# Patient Record
Sex: Female | Born: 1960 | Race: White | Hispanic: No | Smoking: Former smoker
Health system: Southern US, Community
[De-identification: ages and names within clinical notes are randomized; demographics above are authoritative.]

## PROBLEM LIST (undated history)

## (undated) DIAGNOSIS — R011 Cardiac murmur, unspecified: Secondary | ICD-10-CM

## (undated) DIAGNOSIS — G4730 Sleep apnea, unspecified: Secondary | ICD-10-CM

## (undated) DIAGNOSIS — E119 Type 2 diabetes mellitus without complications: Secondary | ICD-10-CM

## (undated) HISTORY — PX: OTHER SURGICAL HISTORY: SHX169

## (undated) HISTORY — PX: DILATION AND CURETTAGE OF UTERUS: SHX78

## (undated) HISTORY — DX: Type 2 diabetes mellitus without complications: E11.9

## (undated) HISTORY — PX: TUBAL LIGATION: SHX77

## (undated) HISTORY — PX: TONSILLECTOMY: SUR1361

---

## 1997-11-06 ENCOUNTER — Emergency Department (HOSPITAL_COMMUNITY): Admission: EM | Admit: 1997-11-06 | Discharge: 1997-11-06 | Payer: Self-pay | Admitting: Emergency Medicine

## 1998-06-20 ENCOUNTER — Other Ambulatory Visit: Admission: RE | Admit: 1998-06-20 | Discharge: 1998-06-20 | Payer: Self-pay | Admitting: Internal Medicine

## 2002-01-26 DIAGNOSIS — E119 Type 2 diabetes mellitus without complications: Secondary | ICD-10-CM

## 2002-01-26 HISTORY — DX: Type 2 diabetes mellitus without complications: E11.9

## 2002-11-16 ENCOUNTER — Ambulatory Visit (HOSPITAL_COMMUNITY): Admission: RE | Admit: 2002-11-16 | Discharge: 2002-11-16 | Payer: Self-pay | Admitting: Obstetrics & Gynecology

## 2003-01-04 ENCOUNTER — Other Ambulatory Visit: Admission: RE | Admit: 2003-01-04 | Discharge: 2003-01-04 | Payer: Self-pay | Admitting: Dermatology

## 2003-01-25 ENCOUNTER — Ambulatory Visit (HOSPITAL_COMMUNITY): Admission: RE | Admit: 2003-01-25 | Discharge: 2003-01-25 | Payer: Self-pay | Admitting: Obstetrics & Gynecology

## 2004-01-27 HISTORY — PX: OOPHORECTOMY: SHX86

## 2004-12-08 ENCOUNTER — Ambulatory Visit: Payer: Self-pay | Admitting: Endocrinology

## 2004-12-30 ENCOUNTER — Ambulatory Visit: Payer: Self-pay | Admitting: Internal Medicine

## 2004-12-30 ENCOUNTER — Ambulatory Visit: Payer: Self-pay | Admitting: Endocrinology

## 2005-02-05 ENCOUNTER — Ambulatory Visit: Payer: Self-pay | Admitting: Endocrinology

## 2005-02-19 ENCOUNTER — Ambulatory Visit: Payer: Self-pay | Admitting: Endocrinology

## 2005-04-07 ENCOUNTER — Ambulatory Visit: Payer: Self-pay | Admitting: Endocrinology

## 2005-05-08 ENCOUNTER — Encounter (INDEPENDENT_AMBULATORY_CARE_PROVIDER_SITE_OTHER): Payer: Self-pay | Admitting: *Deleted

## 2005-05-08 ENCOUNTER — Ambulatory Visit (HOSPITAL_BASED_OUTPATIENT_CLINIC_OR_DEPARTMENT_OTHER): Admission: RE | Admit: 2005-05-08 | Discharge: 2005-05-08 | Payer: Self-pay | Admitting: Otolaryngology

## 2005-06-02 ENCOUNTER — Ambulatory Visit: Payer: Self-pay | Admitting: Endocrinology

## 2006-02-04 ENCOUNTER — Ambulatory Visit: Payer: Self-pay | Admitting: Internal Medicine

## 2006-02-04 ENCOUNTER — Ambulatory Visit: Payer: Self-pay | Admitting: Endocrinology

## 2006-02-04 LAB — CONVERTED CEMR LAB
AST: 22 units/L (ref 0–37)
Albumin: 3.6 g/dL (ref 3.5–5.2)
Alkaline Phosphatase: 71 units/L (ref 39–117)
BUN: 8 mg/dL (ref 6–23)
Chloride: 99 meq/L (ref 96–112)
Chol/HDL Ratio, serum: 8.1
Cholesterol: 295 mg/dL (ref 0–200)
Creatinine,U: 39.5 mg/dL
GFR calc non Af Amer: 142 mL/min
Hgb A1c MFr Bld: 12.7 % — ABNORMAL HIGH (ref 4.6–6.0)
LDL DIRECT: 117 mg/dL
Microalb, Ur: 0.9 mg/dL (ref 0.0–1.9)
Sodium: 135 meq/L (ref 135–145)
TSH: 2.34 microintl units/mL (ref 0.35–5.50)
VLDL: 141 mg/dL — ABNORMAL HIGH (ref 0–40)

## 2006-03-03 ENCOUNTER — Ambulatory Visit: Payer: Self-pay | Admitting: Endocrinology

## 2006-04-07 ENCOUNTER — Ambulatory Visit: Payer: Self-pay | Admitting: Endocrinology

## 2006-04-28 ENCOUNTER — Encounter (INDEPENDENT_AMBULATORY_CARE_PROVIDER_SITE_OTHER): Payer: Self-pay | Admitting: Specialist

## 2006-04-28 ENCOUNTER — Ambulatory Visit (HOSPITAL_COMMUNITY): Admission: RE | Admit: 2006-04-28 | Discharge: 2006-04-28 | Payer: Self-pay | Admitting: Obstetrics & Gynecology

## 2006-06-08 ENCOUNTER — Ambulatory Visit: Payer: Self-pay | Admitting: Endocrinology

## 2006-08-16 ENCOUNTER — Encounter: Payer: Self-pay | Admitting: Endocrinology

## 2006-08-16 DIAGNOSIS — J309 Allergic rhinitis, unspecified: Secondary | ICD-10-CM | POA: Insufficient documentation

## 2006-08-16 DIAGNOSIS — E109 Type 1 diabetes mellitus without complications: Secondary | ICD-10-CM | POA: Insufficient documentation

## 2006-12-21 ENCOUNTER — Ambulatory Visit: Payer: Self-pay | Admitting: Endocrinology

## 2006-12-21 ENCOUNTER — Encounter (INDEPENDENT_AMBULATORY_CARE_PROVIDER_SITE_OTHER): Payer: Self-pay | Admitting: *Deleted

## 2006-12-21 DIAGNOSIS — R079 Chest pain, unspecified: Secondary | ICD-10-CM

## 2006-12-21 DIAGNOSIS — F411 Generalized anxiety disorder: Secondary | ICD-10-CM | POA: Insufficient documentation

## 2006-12-27 ENCOUNTER — Ambulatory Visit: Payer: Self-pay

## 2006-12-27 ENCOUNTER — Encounter: Payer: Self-pay | Admitting: Endocrinology

## 2007-01-19 ENCOUNTER — Encounter: Payer: Self-pay | Admitting: Endocrinology

## 2007-01-24 ENCOUNTER — Ambulatory Visit: Payer: Self-pay | Admitting: Endocrinology

## 2007-03-08 ENCOUNTER — Ambulatory Visit: Payer: Self-pay | Admitting: Endocrinology

## 2007-03-08 DIAGNOSIS — E7800 Pure hypercholesterolemia, unspecified: Secondary | ICD-10-CM

## 2007-03-08 LAB — CONVERTED CEMR LAB: Hgb A1c MFr Bld: 8 % — ABNORMAL HIGH (ref 4.6–6.0)

## 2007-03-31 ENCOUNTER — Telehealth (INDEPENDENT_AMBULATORY_CARE_PROVIDER_SITE_OTHER): Payer: Self-pay | Admitting: *Deleted

## 2007-10-24 ENCOUNTER — Encounter: Payer: Self-pay | Admitting: Endocrinology

## 2008-01-17 ENCOUNTER — Ambulatory Visit: Payer: Self-pay | Admitting: Internal Medicine

## 2008-01-17 DIAGNOSIS — J019 Acute sinusitis, unspecified: Secondary | ICD-10-CM | POA: Insufficient documentation

## 2008-01-17 DIAGNOSIS — R040 Epistaxis: Secondary | ICD-10-CM | POA: Insufficient documentation

## 2008-02-17 ENCOUNTER — Ambulatory Visit: Payer: Self-pay | Admitting: Internal Medicine

## 2008-02-17 LAB — CONVERTED CEMR LAB
CO2: 32 meq/L (ref 19–32)
Chloride: 90 meq/L — ABNORMAL LOW (ref 96–112)
GFR calc non Af Amer: 114 mL/min
Hgb A1c MFr Bld: 13.2 % — ABNORMAL HIGH (ref 4.6–6.0)
Potassium: 3.6 meq/L (ref 3.5–5.1)
Total CHOL/HDL Ratio: 7.5
VLDL: 138 mg/dL — ABNORMAL HIGH (ref 0–40)

## 2008-02-20 ENCOUNTER — Ambulatory Visit: Payer: Self-pay | Admitting: Endocrinology

## 2008-03-27 ENCOUNTER — Ambulatory Visit: Payer: Self-pay | Admitting: Endocrinology

## 2008-03-27 DIAGNOSIS — R109 Unspecified abdominal pain: Secondary | ICD-10-CM | POA: Insufficient documentation

## 2008-03-27 LAB — CONVERTED CEMR LAB
Albumin: 3.9 g/dL (ref 3.5–5.2)
Amylase: 52 units/L (ref 27–131)
BUN: 15 mg/dL (ref 6–23)
Basophils Absolute: 0 10*3/uL (ref 0.0–0.1)
Bilirubin, Direct: 0.1 mg/dL (ref 0.0–0.3)
Calcium: 9.6 mg/dL (ref 8.4–10.5)
Cholesterol: 384 mg/dL (ref 0–200)
Crystals: NEGATIVE
Direct LDL: 172.8 mg/dL
Eosinophils Absolute: 0.1 10*3/uL (ref 0.0–0.7)
Eosinophils Relative: 1.6 % (ref 0.0–5.0)
GFR calc Af Amer: 137 mL/min
Glucose, Bld: 486 mg/dL — ABNORMAL HIGH (ref 70–99)
HCT: 40.9 % (ref 36.0–46.0)
Hemoglobin, Urine: NEGATIVE
MCHC: 35.1 g/dL (ref 30.0–36.0)
MCV: 96.3 fL (ref 78.0–100.0)
Monocytes Absolute: 0.6 10*3/uL (ref 0.1–1.0)
Platelets: 264 10*3/uL (ref 150–400)
RDW: 13.6 % (ref 11.5–14.6)
Sodium: 132 meq/L — ABNORMAL LOW (ref 135–145)
Total Protein: 7.3 g/dL (ref 6.0–8.3)
Triglycerides: 777 mg/dL (ref 0–149)
Urine Glucose: >=1000 mg/dL — CR
Urobilinogen, UA: 0.2 (ref 0.0–1.0)

## 2008-04-06 ENCOUNTER — Encounter: Admission: RE | Admit: 2008-04-06 | Discharge: 2008-04-06 | Payer: Self-pay | Admitting: Endocrinology

## 2008-04-10 ENCOUNTER — Encounter: Payer: Self-pay | Admitting: Endocrinology

## 2008-04-12 ENCOUNTER — Ambulatory Visit (HOSPITAL_COMMUNITY): Admission: RE | Admit: 2008-04-12 | Discharge: 2008-04-12 | Payer: Self-pay | Admitting: General Surgery

## 2008-05-14 ENCOUNTER — Encounter: Payer: Self-pay | Admitting: Endocrinology

## 2008-05-31 ENCOUNTER — Encounter: Payer: Self-pay | Admitting: Endocrinology

## 2009-04-23 ENCOUNTER — Encounter (INDEPENDENT_AMBULATORY_CARE_PROVIDER_SITE_OTHER): Payer: Self-pay | Admitting: *Deleted

## 2009-04-23 LAB — CONVERTED CEMR LAB
Calcium: 9.7 mg/dL
GFR calc Af Amer: >60 mL/min
GFR calc non Af Amer: >60 mL/min
Glucose, Bld: 310 mg/dL

## 2009-09-18 ENCOUNTER — Telehealth: Payer: Self-pay | Admitting: Endocrinology

## 2009-09-18 ENCOUNTER — Ambulatory Visit: Payer: Self-pay | Admitting: Endocrinology

## 2009-09-18 DIAGNOSIS — E876 Hypokalemia: Secondary | ICD-10-CM

## 2009-09-18 LAB — CONVERTED CEMR LAB
Albumin: 4.3 g/dL (ref 3.5–5.2)
Alkaline Phosphatase: 125 units/L — ABNORMAL HIGH (ref 39–117)
Basophils Absolute: 0 10*3/uL (ref 0.0–0.1)
Bilirubin Urine: NEGATIVE
CO2: 29 meq/L (ref 19–32)
Calcium: 9.6 mg/dL (ref 8.4–10.5)
Cholesterol: 255 mg/dL — ABNORMAL HIGH (ref 0–200)
Creatinine, Ser: 0.4 mg/dL (ref 0.4–1.2)
Creatinine,U: 53.5 mg/dL
Direct LDL: 157.7 mg/dL
Eosinophils Absolute: 0.1 10*3/uL (ref 0.0–0.7)
Glucose, Bld: 215 mg/dL — ABNORMAL HIGH (ref 70–99)
Hgb A1c MFr Bld: 11 % — ABNORMAL HIGH (ref 4.6–6.5)
Lymphocytes Relative: 34.1 % (ref 12.0–46.0)
MCHC: 35.3 g/dL (ref 30.0–36.0)
MCV: 93.6 fL (ref 78.0–100.0)
Microalb, Ur: 1.2 mg/dL (ref 0.0–1.9)
Monocytes Absolute: 0.6 10*3/uL (ref 0.1–1.0)
Neutro Abs: 3.9 10*3/uL (ref 1.4–7.7)
Neutrophils Relative %: 55.6 % (ref 43.0–77.0)
RDW: 12.5 % (ref 11.5–14.6)
Total Protein, Urine: NEGATIVE mg/dL
Triglycerides: 325 mg/dL — ABNORMAL HIGH (ref 0.0–149.0)
Urine Glucose: >=1000 mg/dL
Urobilinogen, UA: 0.2 (ref 0.0–1.0)

## 2010-01-26 HISTORY — PX: SHOULDER SURGERY: SHX246

## 2010-02-14 ENCOUNTER — Telehealth (INDEPENDENT_AMBULATORY_CARE_PROVIDER_SITE_OTHER): Payer: Self-pay | Admitting: *Deleted

## 2010-02-27 NOTE — Progress Notes (Signed)
  Phone Note Other Incoming   Request: Send information Summary of Call: Request for records received from Kathleene Hazel. Margo Aye, M.D. Request forwarded to [facility].

## 2010-02-27 NOTE — Letter (Signed)
Summary: Generic Letter   [facility]  [address] [place], [place] [address]   Phone: [phone]  Fax: [phone]    09/18/2009  ADDISSON FRATE [address] [place], [place]  [address]  Dear Ms. Goates,  You need to carry insulin and syringes for your diabetes.   Sincerely,   Romero Belling MD

## 2010-02-27 NOTE — Assessment & Plan Note (Signed)
Summary: FU / NEEDS A NOTE/NWS   Vital Signs:  Patient profile:   50 year old female Height:      63 inches (160.02 cm) Weight:      182.13 pounds (82.79 kg) BMI:     32.38 O2 Sat:      96 % on Room air Temp:     97.6 degrees F (36.44 degrees C) oral Pulse rate:   84 / minute BP sitting:   110 / 74  (left arm) Cuff size:   regular  Vitals Entered By: Brenton Grills MA (September 18, 2009 7:53 AM)  O2 Flow:  Room air CC: F/U appt/needs a note/aj Is Patient Diabetic? Yes   CC:  F/U appt/needs a note/aj.  History of Present Illness: the status of at least 3 ongoing medical problems is addressed today: dm:  she has hypoglycenmic sxs after breakfast, if the meal is smaller than anticipated.  she says it is highest in am (higher than at hs, despite no hs-snack).   hypokalemia:  she takes florinef as rx'ed. dyslipidemia:  she takes pravachol as rx'ed.  Current Medications (verified): 1)  Lantus 100 Unit/ml  Soln (Insulin Glargine) .Marland KitchenMarland KitchenMarland Kitchen 150 Units Once Daily 2)  Singulair 10 Mg  Tabs (Montelukast Sodium) .... Take 1 By Mouth Qd 3)  Novolog Flexpen 100 Unit/ml  Soln (Insulin Aspart) .... 40 Units Three Times A Day (Qac) 4)  Furosemide 10 Mg/ml Oral Soln (Furosemide) .... Take 1 Every Other Day 5)  Chlorthalidone 25 Mg  Tabs (Chlorthalidone) .... Take 1 By Mouth Qd 6)  Klor-Con 10 10 Meq  Tbcr (Potassium Chloride) .... Take 1 By Mouth Qd 7)  Prempro 0.625-2.5 Mg  Tabs (Conj Estrog-Medroxyprogest Ace) .... Take 1 By Mouth Qd 8)  Voltaren 75 Mg  Tbec (Diclofenac Sodium) .... Take 1 By Mouth Once Daily Prn 9)  Fluticasone Propionate 50 Mcg/act Susp (Fluticasone Propionate) .... Spray 2 Spray Into Both  Nostrils Once A Day 10)  Lidex 0.05 % Crea (Fluocinonide) .... Apply A Small Amount To Affected Area Twice A  Day 11)  Accusure Insulin Syringe 31g X 5/16" 1 Ml  Misc (Insulin Syringe-Needle U-100) .... Any Brand, Use Qd 12)  Pravastatin Sodium 80 Mg Tabs (Pravastatin Sodium) ....  Qhs  Allergies (verified): 1)  ! Sulfa 2)  ! Codeine 3)  ! Ceclor  Past History:  Past Medical History: Last updated: 02/20/2008 [place] Disease EPISTAXIS, RECURRENT (ICD-784.7) SINUSITIS- ACUTE-NOS (ICD-461.9) HYPERCHOLESTEROLEMIA (ICD-272.0) CHEST PAIN UNSPECIFIED (ICD-786.50) ANXIETY STATE, UNSPECIFIED (ICD-300.00) DIABETES MELLITUS, TYPE I (ICD-250.01) ALLERGIC RHINITIS (ICD-477.9)  Review of Systems       The patient complains of weight gain.  The patient denies syncope.    Physical Exam  General:  obese.  no distress  Pulses:  dorsalis pedis intact bilat.   Extremities:  no deformity.  no ulcer on the feet.  feet are of normal color and temp.  no edema  Neurologic:  sensation is intact to touch on the feet  Additional Exam:   Potassium                 4.1 mEq/L   Hemoglobin A1C       [H]  11.0 %   Cholesterol LDL       157.7 mg/dL   Impression & Recommendations:  Problem # 1:  DIABETES MELLITUS, TYPE I (ICD-250.01) i think she would do better with a simpler regimen  Problem # 2:  HYPOKALEMIA (ICD-276.8) well-replaced  Problem # 3:  HYPERCHOLESTEROLEMIA (ICD-272.0) needs increased rx  Medications Added to Medication List This Visit: 1)  Lantus 100 Unit/ml Soln (Insulin glargine) .Marland KitchenMarland KitchenMarland Kitchen 175 units once daily 2)  Novolog Flexpen 100 Unit/ml Soln (Insulin aspart) .... Three times a day (just before each meal) 30-40-40 units, and pen nedles 4x a day 3)  Lantus Solostar 100 Unit/ml Soln (Insulin glargine) .Marland KitchenMarland KitchenMarland Kitchen 175 units once daily  Other Orders: TLB-Lipid Panel (80061-LIPID) TLB-BMP (Basic Metabolic Panel-BMET) (80048-METABOL) TLB-CBC Platelet - w/Differential (85025-CBCD) TLB-Hepatic/Liver Function Pnl (80076-HEPATIC) TLB-TSH (Thyroid Stimulating Hormone) (84443-TSH) TLB-A1C / Hgb A1C (Glycohemoglobin) (83036-A1C) TLB-Microalbumin/Creat Ratio, Urine (82043-MALB) TLB-Udip w/ Micro (81001-URINE) Est. Patient Level IV ([address])  Patient Instructions: 1)   blood tests are being ordered for you today.  please call [phone] to hear your test results. 2)  pending the test results, please increase lantus to 175 units at bedtime, and decrease novolog to three times a day (just before each meal), 30-40-40 units 3)  Please schedule a physical appointment in 1 month. 4)  (update: i left message on phone-tree:  i advised changing pravachol to crestor.  i offered a simpler dm regimen). Prescriptions: PRAVASTATIN SODIUM 80 MG TABS (PRAVASTATIN SODIUM) qhs  #30 x 3   Entered and Authorized by:   Minus Breeding MD   Signed by:   Minus Breeding MD on 09/18/2009   Method used:   Electronically to        [facility]       [address]       [place], [place]  [address]       Ph: [phone]       Fax: [phone]   RxID:   [id number] VOLTAREN 75 MG  TBEC (DICLOFENAC SODIUM) TAKE 1 by mouth once daily PRN  #30 x 3   Entered and Authorized by:   Minus Breeding MD   Signed by:   Minus Breeding MD on 09/18/2009   Method used:   Electronically to        [facility]       [address]       [place], [place]  [address]       Ph: [phone]       Fax: [phone]   RxID:   [id number] KLOR-CON 10 10 MEQ  TBCR (POTASSIUM CHLORIDE) TAKE 1 by mouth QD  #30 x 3   Entered and Authorized by:   Minus Breeding MD   Signed by:   Minus Breeding MD on 09/18/2009   Method used:   Electronically to        [facility]  [address] (retail)       [address]       [place], [place]  [address]       Ph: [phone]       Fax: [phone]   RxID:   [id number] CHLORTHALIDONE 25 MG  TABS (CHLORTHALIDONE) TAKE 1 by mouth QD  #30 x 3   Entered and Authorized by:   Minus Breeding MD   Signed by:   Minus Breeding MD on 09/18/2009   Method used:   Electronically to        [facility] [facility] ([facility]       [address]  [place], [place]   [address]       Ph: [phone]       Fax: [phone]   RxID:   [id number] FUROSEMIDE 10 MG/ML ORAL SOLN (FUROSEMIDE) TAKE 1 EVERY OTHER DAY  #30 x 3   Entered and Authorized by:   Minus Breeding MD   Signed by:   Minus Breeding MD on 09/18/2009   Method used:   Electronically to        [facility] [facility]       [address]       [place], [place]  [address]       Ph: [phone]       Fax: [phone]   RxID:   [id number] NOVOLOG FLEXPEN 100 UNIT/ML  SOLN (INSULIN ASPART) three times a day (just before each meal) 30-40-40 units, and pen nedles 4x a day  #4 boxes x 3   Entered and Authorized by:   Minus Breeding MD   Signed by:   Minus Breeding MD on 09/18/2009   Method used:   Electronically to        [facility] [facility]       [address]       [place], [place]  [address]       Ph: [phone]       Fax: [phone]   RxID:   [id number] LANTUS 100 UNIT/ML  SOLN (INSULIN GLARGINE) 175 units once daily  #7 boxes x 3   Entered and Authorized by:   Minus Breeding MD   Signed by:   Minus Breeding MD on 09/18/2009   Method used:   Electronically to        [facility] [facility]       [address]       [place], [place]  [address]       Ph: [phone]       Fax: [phone]   RxID:   [id number] VOLTAREN 75 MG  TBEC (DICLOFENAC SODIUM) TAKE 1 by mouth once daily PRN  #90 x 3   Entered and Authorized by:   Minus Breeding MD   Signed by:   Minus Breeding MD on 09/18/2009   Method used:   Faxed to ...       [facility] (mail-order)             , [place]         Ph: [phone]       Fax: [phone]   RxID:   [id number] CHLORTHALIDONE 25 MG  TABS (CHLORTHALIDONE) TAKE 1 by mouth QD  #90 x 3   Entered and Authorized by:   Minus Breeding MD   Signed by:   Minus Breeding MD on 09/18/2009   Method used:   Faxed to ...       [facility] (mail-order)             , [place]         Ph:  [phone]       Fax: [phone]   RxID:   [id number] FUROSEMIDE 10 MG/ML ORAL SOLN (FUROSEMIDE) TAKE 1 EVERY OTHER DAY  #45 x 3   Entered and Authorized by:   Minus Breeding  MD   Signed by:   Minus Breeding MD on 09/18/2009   Method used:   Faxed to ...       [facility] (mail-order)             , [place]         Ph: [phone]       Fax: [phone]   RxID:   [id number] PRAVASTATIN SODIUM 80 MG TABS (PRAVASTATIN SODIUM) qhs  #90 x 3   Entered and Authorized by:   Minus Breeding MD   Signed by:   Minus Breeding MD on 09/18/2009   Method used:   Faxed to ...       [facility] (mail-order)             , [place]         Ph: [phone]       Fax: [phone]   RxID:   [id number] KLOR-CON 10 10 MEQ  TBCR (POTASSIUM CHLORIDE) TAKE 1 by mouth QD  #90 x 3   Entered and Authorized by:   Minus Breeding MD   Signed by:   Minus Breeding MD on 09/18/2009   Method used:   Faxed to ...       [facility] (mail-order)             , [place]         Ph: [phone]       Fax: [phone]   RxID:   [id number] NOVOLOG FLEXPEN 100 UNIT/ML  SOLN (INSULIN ASPART) three times a day (just before each meal) 30-40-40 units, and pen nedles 4x a day  #12 boxes x 3   Entered and Authorized by:   Minus Breeding MD   Signed by:   Minus Breeding MD on 09/18/2009   Method used:   Faxed to ...       [facility] (mail-order)             , [place]         Ph: [phone]       Fax: [phone]   RxID:   [id number] LANTUS 100 UNIT/ML  SOLN (INSULIN GLARGINE) 175 units once daily  #19 boxes x 3   Entered and Authorized by:   Minus Breeding MD   Signed by:   Minus Breeding MD on 09/18/2009   Method used:   Faxed to ...       [facility] (mail-order)             , [place]         Ph: [phone]       Fax: [phone]   RxID:   [id number]

## 2010-02-27 NOTE — Miscellaneous (Signed)
Summary: Labs   Clinical Lists Changes  Observations: Added new observation of GFRAA: >60 (04/23/2009 16:36) Added new observation of GFR: >60 (04/23/2009 16:36) Added new observation of CALCIUM: 9.7 mg/dL (14/78/2956 21:30) Added new observation of CO2 PLSM/SER: 23 meq/L (04/23/2009 16:36) Added new observation of CL SERUM: 96 meq/L (04/23/2009 16:36) Added new observation of K SERUM: 4.4 meq/L (04/23/2009 16:36) Added new observation of NA: 136 meq/L (04/23/2009 16:36) Added new observation of CREATININE: 0.57 mg/dL (86/57/8469 62:95) Added new observation of BUN: 13 mg/dL (28/41/3244 01:02) Added new observation of BG RANDOM: 310 mg/dL (72/53/6644 03:47)      -  Date:  04/23/2009    BG Random: 310    BUN: 13    Creatinine: 0.57    Sodium: 136    Potassium: 4.4    Chloride: 96    CO2 Total: 23    Calcium: 9.7    GFR(Non African American): >60    GFR(African American): >60

## 2010-02-27 NOTE — Progress Notes (Signed)
Summary: furosemide rx  Phone Note Other Incoming   Caller: [facility] [phone] Summary of Call: [facility] in [place] sent fax regarding pt's Furosemide. Do you want pt to receive Furosemide solution or tablets? If tablets what dosage? Initial call taken by: Brenton Grills MA,  September 18, 2009 10:41 AM  Follow-up for Phone Call        i corrected and resent Follow-up by: Minus Breeding MD,  September 18, 2009 1:42 PM    New/Updated Medications: FUROSEMIDE 20 MG TABS (FUROSEMIDE) 1 tab every other day Prescriptions: FUROSEMIDE 20 MG TABS (FUROSEMIDE) 1 tab every other day  #30 x 5   Entered and Authorized by:   Minus Breeding MD   Signed by:   Minus Breeding MD on 09/18/2009   Method used:   Electronically to        [facility]       [address]       [place], [place]  [address]       Ph: [phone]       Fax: [phone]   RxID:   [id number]

## 2010-06-13 NOTE — Op Note (Signed)
Maureen Garza, Maureen Garza NO.:  [id number]   MEDICAL RECORD NO.:  [id number]          PATIENT TYPE:  AMB   LOCATION:  DSC                          FACILITY:  [facility]   PHYSICIAN:  Hermelinda Medicus, M.D.   DATE OF BIRTH:  [birth year]   DATE OF PROCEDURE:  05/08/2005  DATE OF DISCHARGE:                                 OPERATIVE REPORT   PREOPERATIVE DIAGNOSES:  1.  Tonsillitis.  2.  History of Strep-positive tonsillitis.  3.  History of multiple rounds of antibiotics and Strep-positive status.   POSTOPERATIVE DIAGNOSES:  1.  Tonsillitis.  2.  History of Strep-positive tonsillitis.  3.  History of multiple rounds of antibiotics and Strep-positive status.   OPERATION:  Tonsillectomy.   ANESTHESIA:  General endotracheal.   ANESTHESIOLOGIST:  Kipp Brood, M.D.   OPERATOR:  Hermelinda Medicus, M.D.   PROCEDURE:  The patient was placed in a supine position.  Under general  orotracheal anesthesia, the tonsils were removed using blunt and Bovie  electrocoagulation dissection.  All hemostasis was established with Bovie  coagulation.  The stomach was suctioned, the nasopharynx was suctioned and  gag was slowly released, showing no  evidence of any further bleeding.  Blood loss was established at  approximately 20 mL.  The patient tolerated the procedure very well and is  doing well postop.   Followup will be in 5 days, then 2 weeks, 4 weeks and 6 weeks.           ______________________________  Hermelinda Medicus, M.D.     JC/MEDQ  D:  05/08/2005  T:  05/08/2005  Job:  [id number]   cc:   Gregary Signs A. Everardo All, M.D. [facility]  [address]  [place]  [place] [address]   Corwin Levins, M.D. [facility]  [address]  [place]  [place] [address]

## 2010-06-13 NOTE — Op Note (Signed)
[name], Maureen Garza                 ACCOUNT NO.:  [id number]   MEDICAL RECORD NO.:  [id number]          PATIENT TYPE:  AMB   LOCATION:  DAY                           FACILITY:  [facility]   PHYSICIAN:  Lazaro Arms, M.D.   DATE OF BIRTH:  1960/03/01   DATE OF PROCEDURE:  04/28/2006  DATE OF DISCHARGE:                               OPERATIVE REPORT   PREOPERATIVE DIAGNOSES:  1. Left ovarian cyst.  2. Pelvic pain.   POSTOPERATIVE DIAGNOSIS:  Left paratubal cyst.   OPERATION PERFORMED:  Laparoscopic bilateral salpingo-oophorectomy.   SURGEON:  Lazaro Arms, M.D.   ANESTHESIA:  General endotracheal.   FINDINGS:  The patient had an approximately 4.5 cm left paratubal cyst.  It was certainly adjacent to the ovary but both ovaries appeared to be  normal.  The uterus and other peritoneal cavity was completely normal.   DESCRIPTION OF PROCEDURE:  The patient was taken to the operating room,  placed in the supine position where she underwent general endotracheal  anesthesia.  She was prepped and draped in the usual sterile fashion for  laparoscopic procedure.  A Foley catheter was placed during the  procedure.  Incision was made in the umbilicus.  A open laparoscopy was  performed, dissected down to the fascia, cut the fascia sharply and  entered the peritoneal cavity bluntly manually.  The peritoneal cavity  was insufflated.  The patient is very short waisted and had a lot of  abdominal fat between her pubis and umbilicus which made the surgery  more technically difficult throughout.  Incision was made two  fingerbreadths above the umbilicus and also in the left lower quadrant  and 5 mm trocars were placed in these sites under direct visualization  without difficulty.  The Harmonic scalpel was used.  The left  infundibulopelvic ligament and tubo-ovarian ligament, utero-ovarian  ligaments were taken down using the Harmonic scalpel and the left ovary  and tube was removed.  The right  ovary and tube were then identified.  The right infundibulopelvic ligament was isolated.  Harmonic scalpel was  used and this ligament was taken down as well as the utero-ovarian  ligament and tubo-ovarian ligament as well.  There was a small bleeder  on the right side. Harmonic scalpel was used and this made hemostatic.  The pelvis was irrigated vigorously and all pedicles were found to be  hemostatic.  The instruments were removed.  The gas was allowed to  escape.  The umbilical fascia was  closed with a single 0 Vicryl suture.  A subcutaneous suture was placed  using 0-0 Vicryl as well.  All the skin incisions were closed with  staples.  The patient tolerated the procedure well.  She experienced  about 100 mL of blood loss and was taken to recovery room in good and  stable condition.  All counts correct x3.      Lazaro Arms, M.D.  Electronically Signed     LHE/MEDQ  D:  04/28/2006  T:  04/28/2006  Job:  [id number]

## 2010-06-13 NOTE — H&P (Signed)
[name], Maureen Garza                           ACCOUNT NO.:  [id number]   MEDICAL RECORD NO.:  [id number]                  PATIENT TYPE:   LOCATION:                                       FACILITY:  [facility]   PHYSICIAN:  Lazaro Arms, M.D.                DATE OF BIRTH:  1960-05-08   DATE OF ADMISSION:  DATE OF DISCHARGE:                                HISTORY & PHYSICAL   ANTICIPATED DATE OF ADMISSION:  January 25, 2003.   HISTORY OF PRESENT ILLNESS:  Maureen Garza is a 50 year old white female, gravida 3,  para 3, aborta 0, who had a tubal ligation in 2003  she states her periods  have become chronically much heavier with much worse cramping since May  2003.  She passes clots as well as her in her clothes and bed sheets that  requires her to wear pads and tampons together during her periods.  We gave  her Megace to stop her bleeding and had her set up for a hysteroscopy, D&C  and endometrial ablation October 21st.  However, at that time she had an  elevated fasting glucose and we cancelled her surgery because of undiagnosed  adult onset diabetes.  She saw Dr. Ouida Sills who has since put her on oral  hyperglycemic agents and got her blood sugar under control, and as a result  she is now admitted for hysteroscopy, D&C and endometrial ablation for  treatment.   PAST MEDICAL HISTORY:  Adult onset diabetes diagnosed in October 2004.   PAST SURGICAL HISTORY:  Tubal ligation in 2003.   PAST OBSTETRICAL HISTORY:  Three vaginal deliveries.   REVIEW OF SYSTEMS:  Negative.   ALLERGIES:  CECLOR and CODEINE.   MEDICATIONS:  Oral hyperglycemics.   PHYSICAL EXAMINATION:  VITAL SIGNS:  The patient is 5 feet 8 inches and  weighs 162 pounds. Blood pressure 130/80.  HEENT:  Unremarkable.  NECK:  Thyroid is normal.  LUNGS:  Lungs clear.  HEART:  Heart is regular rate and rhythm without murmur, regurg or gallop.  ABDOMEN:  Abdomen is benign without hepatosplenomegaly or masses.  BREASTS:  Breasts are  without masses, discharge or skin changes.  PELVIC:  Pelvic exam reveals normal size uterus.  Normal adnexa.  Vagina is  pink and moist without discharge.  The adnexa are negative.  EXTREMITIES:  The extremities are warm with no edema.  NEUROLOGIC:  Neurologic exam is grossly intact.   IMPRESSION:  1. Menometrorrhagia and dysmenorrhea responsive to Megace.  2. Adult onset diabetes.   PLAN:  Patient admitted for hysteroscopy, D&C and endometrial ablation.  She  understands the risks, benefits, indications, and alternatives, and will  proceed.     ___________________________________________  Lazaro Arms, M.D.   Loraine Maple  D:  01/24/2003  T:  01/25/2003  Job:  [id number]

## 2010-06-13 NOTE — H&P (Signed)
NAMENEVILLE, PAULS NO.:  [id number]   MEDICAL RECORD NO.:  [id number]          PATIENT TYPE:  AMB   LOCATION:  DSC                          FACILITY:  [facility]   PHYSICIAN:  Hermelinda Medicus, M.D.   DATE OF BIRTH:  [birth year]   DATE OF ADMISSION:  05/08/2005  DATE OF DISCHARGE:                                HISTORY & PHYSICAL   HISTORY OF PRESENT ILLNESS:  This patient is a 50 year old female who has  had a long history of tonsillitis problems and has been on several rounds of  antibiotics; has been Strep positive on more than one occasion and has a  history of being on Amoxicillin, Augmentin and several antibiotics in the  past leading her to a CECLOR, CODEINE and SULFA ALLERGY.  She now enters for  a tonsillectomy as she continues to have these problems on quite a  repetitive basis.  She also is a diabetic using insulin, Lantus 60 units  q.h.s. and NovoLog 20 units q.a.c. and t.i.d.  She is also on Singulair for  respiratory and allergy difficulties and she also has Meniere's' and is on  hydrochlorothiazide 25 mg daily and potassium 10 mEq daily.   ALLERGIES:  SULFA, CECLOR and CODEINE causing rash.   PAST MEDICAL HISTORY:  Furthermore, she does not smoke or drink.  She had a  D and C and a tubal ligation.  She has had type 1 insulin-dependent  gestational diabetes.  The remainder of her past history is unremarkable.   REVIEW OF SYSTEMS:  Unremarkable.   PHYSICAL EXAMINATION:  VITAL SIGNS:  Her blood pressure is 121/79. Weight is  188. CBG was elevated at 321 but now is 228.  HEENT:  Ears are clear. Tympanic membranes are clear. Oral cavity is clear  except for tonsillar hypertrophy.  NECK:  Free of any thyromegaly, cervical adenopathy or mass. Mild tenderness  of the neck, however.  She is on Amoxicillin, 250 mg three times a day.  Her  larynx is clear.  True cords, false cords, epiglottis, base of tongue are  clear of any ulceration or mass.  True cord  mobility, gag reflex, tongue  mobility, extraocular muscles, facial nerves are all symmetrical.  CHEST:  Clear with no rales, rhonchi or wheezing.  CARDIOVASCULAR:  Normal sounds, no murmurs or gallops.  ABDOMEN:  Unremarkable, mildly overweight.   LABORATORY DATA:  Her laboratory work shows a glucose of 240 on routine  evaluation.  Her electrocardiogram is normal sinus rhythm.   INITIAL DIAGNOSIS:  1.  Tonsillitis.  2.  History of diabetes type 1.  3.  History of Meniere's'.           ______________________________  Hermelinda Medicus, M.D.     JC/MEDQ  D:  05/08/2005  T:  05/08/2005  Job:  [id number]   cc:   Corwin Levins, M.D. [facility]  [address]  [place]  [place] [address]   Gregary Signs A. Everardo All, M.D. [facility]  [address]  [place]  [place] [address]

## 2010-06-13 NOTE — H&P (Signed)
   NAMESACHEEN, ARRASMITH                           ACCOUNT NO.:  [id number]   MEDICAL RECORD NO.:  [id number]                   PATIENT TYPE:  AMB   LOCATION:  DAY                                  FACILITY:  [facility]   PHYSICIAN:  Lazaro Arms, M.D.                DATE OF BIRTH:  Feb 03, 1960   DATE OF ADMISSION:  DATE OF DISCHARGE:                                HISTORY & PHYSICAL   PREOPERATIVE HISTORY AND PHYSICAL:   DATE OF SURGERY:  November 16, 2002   HISTORY OF PRESENT ILLNESS:  Maureen Garza is a 50 year old white female gravida 3,  para 3, abortus 0 status post tubal ligation 2003 who states that her  periods have become chronically much heavier with much worse cramping since  about May 2003.  She states that she passes clots, soils her clothes and bed  sheets and requires her to wear pads and tampons together.  She was given  Megace to stop her bleeding which has been effective.  An ultrasound was  performed and is basically benign.  As a result she is admitted for  hysteroscopy, D&C, endometrial ablation for menometrorrhagia and  dysmenorrhea.   PAST MEDICAL HISTORY:  Negative.   PAST SURGICAL HISTORY:  Tubal ligation 2003.   PAST OBSTETRICAL HISTORY:  Three vaginal deliveries.   REVIEW OF SYSTEMS:  Negative.   ALLERGIES:  CECLOR and CODEINE.   MEDICATIONS:  None.   PHYSICAL EXAMINATION:  VITAL SIGNS:  She is 5 feet 8 inches and 160 pounds.  Blood pressure is 130/80.  HEENT:  Unremarkable.  THYROID:  Normal.  LUNGS:  Clear.  ABDOMEN:  Benign; no hepatosplenomegaly or masses.  BREASTS:  Without mass, discharge, skin changes.  PELVIC:  Normal-size uterus and normal adnexa; the vagina is pink and moist  without discharge.  EXTREMITIES:  Warm; no edema.  NEUROLOGIC:  Grossly intact.   IMPRESSION:  1. Increasing menometrorrhagia.  2. Increasing dysmenorrhea.   PLAN:  Patient admitted for hysteroscopy, D&C, endometrial ablation.  She  understands risks, benefits,  indications, alternatives and will proceed and  she was given literature brochure by the manufacturer as a handout.     ___________________________________________                                         Lazaro Arms, M.D.   LHE/MEDQ  D:  11/15/2002  T:  11/15/2002  Job:  [id number]

## 2010-06-13 NOTE — Op Note (Signed)
Maureen Garza, Maureen Garza                           ACCOUNT NO.:  [id number]   MEDICAL RECORD NO.:  [id number]                   PATIENT TYPE:  AMB   LOCATION:  DAY                                  FACILITY:  [facility]   PHYSICIAN:  Lazaro Arms, M.D.                DATE OF BIRTH:  Aug 31, 1960   DATE OF PROCEDURE:  DATE OF DISCHARGE:                                 OPERATIVE REPORT   PREOPERATIVE DIAGNOSES:  1. Menometrorrhagia.  2. Anemia.   POSTOPERATIVE DIAGNOSES:  1. Menometrorrhagia.  2. Anemia.   PROCEDURE:  1. Diagnostic hysteroscopy.  2. Uterine curettage and an endometrial ablation using ThermaChoice     catheter.   SURGEON:  Lazaro Arms, M.D.   ANESTHESIA:  General endotracheal   FINDINGS:  The patient had a uterus that sounded to 10 cm. Her endometrial  cavity was a bit fluffy, but normal.  Curettage was performed and all tissue  was sent to pathology.  There were no abnormalities of the uterus.   DESCRIPTION OF OPERATION:  The patient was taken to the operating room and  placed in the supine position where she underwent general endotracheal  anesthesia.  She was placed in the dorsal lithotomy position and prepped and  draped in the usual sterile fashion.  Her bladder was drained.  A Grave  speculum was placed.  Her cervix was grasped with a single-tooth tenaculum  and she sounded to 10 cm.  The cervix was dilated serially to allow passage  of the diagnostic hysteroscope.  Normal saline was used for distending  media.  The above-noted findings were seen.  A vigorous uterine curettage  was performed.   The endometrial ablation catheter was primed, evacuated and placed in the  endometrial cavity. It took me 3 attempts to fill up the balloon because it  kept dropping the pressure.  Between each time I did a look with the  hysteroscope again, and found that there was no area of perforation at all  in the uterus.  The fundus, posterior wall, or anterior wall.  Finally,  on  the third time when I filled it up and it continued to sort of slowly drop  down after about 43 cc of fluid I looked with the hysteroscope with the  balloon fully dilated and in and took pictures.  There was no question that  the balloon was in the endometrial cavity distended the entire endometrium.  There was no perforation.  I assessed, at this point, that her cervix  entrance into her uterus was large enough that it allowed slow drop in  equilibration of the pressure down the endocervical canal, because it is  clear that the balloon is in the endometrium; and there was absolutely no  perforation.   Total therapy time was 11 minutes and 48 seconds and 8 minutes of  therapeutic heating.  Actually I did not deflate  the balloon. I put an  additional 15 cc in the balloon and was able to pull it out through the  cervix which made me again think that it was the endocervix allowing  leaching of the fluid down inside the balloon.  I had checked the balloon to  make sure that it was not leaking several times; and, again, it was not  leaking.  The patient tolerated the procedure well.  She was awakened from  anesthesia taken to the recovery room in good stable condition.  All counts  were correct.  She received a paracervical block.      ___________________________________________                                            Lazaro Arms, M.D.   LHE/MEDQ  D:  01/25/2003  T:  01/25/2003  Job:  [id number]

## 2010-06-13 NOTE — H&P (Signed)
NAMEANGELENE, ROME NO.:  [id number]   MEDICAL RECORD NO.:  [id number]          PATIENT TYPE:  AMB   LOCATION:  DAY                           FACILITY:  [facility]   PHYSICIAN:  Lazaro Arms, M.D.   DATE OF BIRTH:  May 07, 1960   DATE OF ADMISSION:  04/28/2006  DATE OF DISCHARGE:  LH                              HISTORY & PHYSICAL   Maureen Garza is a 50 year old white female gravida 3, para 3, status post tubal  ligation in 2003, status post an endometrial ablation in December of  2004, who presented to the office back in February with a history of  pain and cramping.  She was also having hot flashes and we put her on  Prempro at that time and that certainly helped.  The pain and cramping  continued.  I did an ultrasound and it revealed a 6 cm simple cyst of  the left ovary.  A CA-125 was drawn and found to be normal.  I was  suspicious of an endometrium as a result of the ablation but there is  none at all; the endometrial stripe is very thin.  I discussed options  with [name] and she wants to proceed with a laparoscopy BSO which is  scheduled.   PAST MEDICAL HISTORY:  Adult onset diabetes for which she takes Lantus  and NovoLog.   PAST SURGICAL HISTORY:  1. Tubal ligation in 2003.  2. She had endometrial ablation in 2004.  She has had no bleeding      since then.   PAST OBSTETRICAL HISTORY:  Three vaginal deliveries.   REVIEW OF SYSTEMS:  Otherwise negative.   ALLERGIES:  1. CECLOR.  2. CODEINE.   MEDICATIONS:  Prempro 0.625/2.5, the Lantus and the NovoLog.   Blood pressure is 150/90 in the office.  Her weight is 183 pounds.  HEENT is unremarkable.  Thyroid is normal.  Lungs are clear.  Heart is  regular rate and rhythm without murmur, rub, or gallop.  Breasts  deferred.  Abdomen is benign.  No hepatosplenomegaly or masses.  She is  tender on the left side, no rebound.  Uterus normal size, shape and  contour.  Adnexa feels full on the left but no distinct mass.   Rectal  exam is negative.  Hemoccult negative.  Extremities are warm with no  edema.   IMPRESSION:  1. A 6 cm left ovarian cyst.  2. Pelvic pain and cramping.  3. Status post ablation.   PLAN:  The patient is admitted for laparoscopic BSO.  She understands  risks, benefits, indications and alternatives, will proceed.      Lazaro Arms, M.D.  Electronically Signed     LHE/MEDQ  D:  04/27/2006  T:  04/28/2006  Job:  [id number]

## 2011-11-08 ENCOUNTER — Ambulatory Visit (INDEPENDENT_AMBULATORY_CARE_PROVIDER_SITE_OTHER): Payer: 59 | Admitting: Family Medicine

## 2011-11-08 ENCOUNTER — Encounter: Payer: Self-pay | Admitting: Family Medicine

## 2011-11-08 VITALS — BP 125/73 | HR 101 | Temp 99.0°F | Resp 18 | Ht 61.5 in | Wt 202.0 lb

## 2011-11-08 DIAGNOSIS — R059 Cough, unspecified: Secondary | ICD-10-CM

## 2011-11-08 DIAGNOSIS — R05 Cough: Secondary | ICD-10-CM

## 2011-11-08 DIAGNOSIS — J329 Chronic sinusitis, unspecified: Secondary | ICD-10-CM

## 2011-11-08 MED ORDER — AZITHROMYCIN 250 MG PO TABS: ORAL_TABLET | ORAL | Status: DC

## 2011-11-08 MED ORDER — BENZONATATE 100 MG PO CAPS: 200.0000 mg | ORAL_CAPSULE | Freq: Two times a day (BID) | ORAL | Status: AC | PRN

## 2011-11-08 NOTE — Progress Notes (Signed)
 [facility]:  Office Visit  Chief Complaint:  Chief Complaint  Patient presents with  . URI    1 week  . Cough    HPI: Maureen Garza is a 51 y.o. female who complains of  Sinus congestion, facial pressure, green cough. Deneis fevers, chill, ear pain.  No past medical history on file. No past surgical history on file. History   Social History  . Marital Status: Divorced    Spouse Name: N/A    Number of Children: N/A  . Years of Education: N/A   Social History Main Topics  . Smoking status: Never Smoker   . Smokeless tobacco: None  . Alcohol Use: No  . Drug Use: None  . Sexually Active: None   Other Topics Concern  . None   Social History Narrative  . None   No family history on file. Allergies  Allergen Reactions  . Cefaclor   . Codeine   . Sulfonamide Derivatives    Prior to Admission medications   Medication Sig Start Date End Date Taking? Authorizing Provider  fish oil-omega-3 fatty acids 1000 MG capsule Take 2 g by mouth daily.   Yes Historical Provider, MD  insulin aspart (NOVOLOG) 100 UNIT/ML injection Inject into the skin 2 (two) times daily between meals.   Yes Historical Provider, MD  lisinopril (PRINIVIL,ZESTRIL) 10 MG tablet Take 10 mg by mouth daily.   Yes Historical Provider, MD  Multiple Vitamins-Minerals (MULTIVITAMIN WITH MINERALS) tablet Take 1 tablet by mouth daily.   Yes Historical Provider, MD     ROS: The patient denies fevers, chills, night sweats, unintentional weight loss, chest pain, palpitations, wheezing, dyspnea on exertion, nausea, vomiting, abdominal pain, dysuria, hematuria, melena, numbness, weakness, or tingling.   All other systems have been reviewed and were otherwise negative with the exception of those mentioned in the HPI and as above.    PHYSICAL EXAM: Filed Vitals:   11/08/11 1221  BP: 125/73  Pulse: 101  Temp: 99 F (37.2 C)  Resp: 18   Filed Vitals:   11/08/11 1221  Height: 5' 1.5" (1.562  m)  Weight: 202 lb (91.627 kg)   Body mass index is 37.55 kg/(m^2).  General: Alert, no acute distress HEENT:  Normocephalic, atraumatic, oropharynx patent. Right sinus tenderness, TM nl. Erythematous OP. NO exudates Cardiovascular:  Regular rate and rhythm, no rubs murmurs or gallops.  No Carotid bruits, radial pulse intact. No pedal edema.  Respiratory: Clear to auscultation bilaterally.  No wheezes, rales, or rhonchi.  No cyanosis, no use of accessory musculature GI: No organomegaly, abdomen is soft and non-tender, positive bowel sounds.  No masses. Skin: No rashes. Neurologic: Facial musculature symmetric. Psychiatric: Patient is appropriate throughout our interaction. Lymphatic: No cervical lymphadenopathy Musculoskeletal: Gait intact.   LABS: Results for orders placed in visit on 09/18/09  CONVERTED [facility] LAB      Component Value Range   Cholesterol 255 (*) 0-200 mg/dL   Triglycerides 161.0 (*) 0.0-149.0 mg/dL   HDL 96.04  >54.09 mg/dL   VLDL 81.1 (*) 9.1-47.8 mg/dL   Total CHOL/HDL Ratio 6     Sodium 140  135-145 meq/L   Potassium 4.1  3.5-5.1 meq/L   Chloride 101  96-112 meq/L   CO2 29  19-32 meq/L   Glucose, Bld 215 (*) 70-99 mg/dL   BUN 14  2-95 mg/dL   Creatinine, Ser 0.4  0.4-1.2 mg/dL   Calcium 9.6  6.2-13.0 mg/dL   GFR calc non Af  Amer 179.94  >60 mL/min   WBC 7.0  4.5-10.5 10*3/microliter   RBC 4.55  3.87-5.11 M/uL   Hemoglobin 15.0  12.0-15.0 g/dL   HCT 16.1  09.6-04.5 %   MCV 93.6  78.0-100.0 fL   MCHC 35.3  30.0-36.0 g/dL   RDW 40.9  81.1-91.4 %   Platelets 283.0  150.0-400.0 K/uL   Neutrophils Relative 55.6  43.0-77.0 %   Lymphocytes Relative 34.1  12.0-46.0 %   Monocytes Relative 7.9  3.0-12.0 %   Eosinophils Relative 1.9  0.0-5.0 %   Basophils Relative 0.5  0.0-3.0 %   Neutro Abs 3.9  1.4-7.7 K/uL   Lymphs Abs 2.4  0.7-4.0 K/uL   Monocytes Absolute 0.6  0.1-1.0 K/uL   Eosinophils Absolute 0.1  0.0-0.7 K/uL   Basophils Absolute 0.0  0.0-0.1 K/uL     Total Bilirubin 0.6  0.3-1.2 mg/dL   Bilirubin, Direct 0.1  0.0-0.3 mg/dL   Alkaline Phosphatase 125 (*) 39-117 units/L   AST 18  0-37 units/L   ALT 30  0-35 units/L   Total Protein 7.1  6.0-8.3 g/dL   Albumin 4.3  7.8-2.9 g/dL   TSH 5.62  1.30-8.65 microintl units/mL   Hemoglobin A1C 11.0 (*) 4.6-6.5 %   Microalb, Ur 1.2  0.0-1.9 mg/dL   Creatinine,U 78.4     Microalb Creat Ratio 2.2  0.0-30.0 mg/g   Color, Urine LT. YELLOW  Yellow;Lt. Yellow   APPearance CAR  Clear   Specific Gravity, Urine 1.025  1.000 - 1.030   pH 5.5  5.0-8.0   Total Protein, Urine NEGATIVE  Negative mg/dL   Urine Glucose >=6962  Negative mg/dL   Ketones, ur TRACE  Negative mg/dL   Bilirubin Urine NEGATIVE  Negative   Hemoglobin, Urine TRACE-LYSED  Negative   Urobilinogen, UA 0.2  0.0 - 1.0   Leukocytes, UA NEGATIVE  Negative   Nitrite NEGATIVE  Negative   WBC, UA 0-2/hpf  0-2/hpf cells/hpf   RBC / HPF 0-2/hpf  0-2/hpf   Squamous Epithelial / LPF Rare(0-4/hpf)  Rare(0-4/hpf) /lpf   Direct LDL 157.7       EKG/XRAY:   Primary read interpreted by Dr. Conley Rolls at [facility].   ASSESSMENT/PLAN: Encounter Diagnoses  Name Primary?  . Sinusitis Yes  . Cough    Right maxillary sinusitis Rx Z pack Tessasalon perles      ,  PHUONG, DO 11/08/2011 1:34 PM

## 2012-03-05 ENCOUNTER — Ambulatory Visit (INDEPENDENT_AMBULATORY_CARE_PROVIDER_SITE_OTHER): Payer: 59 | Admitting: Emergency Medicine

## 2012-03-05 VITALS — BP 119/73 | HR 105 | Temp 98.4°F | Resp 18 | Ht 61.5 in | Wt 207.0 lb

## 2012-03-05 DIAGNOSIS — B029 Zoster without complications: Secondary | ICD-10-CM

## 2012-03-05 MED ORDER — VALACYCLOVIR HCL 1 G PO TABS: 1000.0000 mg | ORAL_TABLET | Freq: Three times a day (TID) | ORAL | Status: DC

## 2012-03-05 NOTE — Patient Instructions (Signed)

## 2012-03-05 NOTE — Progress Notes (Signed)
[facility] [address], [place] [address] [phone]  Date:  03/05/2012   Name:  Maureen Garza   DOB:  [birth year]   MRN:  [id number]  PCP:  Romero Belling, MD    Chief Complaint: Rash   History of Present Illness:  Maureen Garza is a 52 y.o. very pleasant female patient who presents with the following:  Sudden onset of a rash on the right scalp and face.  Does not cross the midline.  Painful and pruritic.  History of shingles.  No fever or chills.  No visual or ocular symptoms.    Patient Active Problem List  Diagnosis  . DIABETES MELLITUS, TYPE I  . HYPERCHOLESTEROLEMIA  . HYPOKALEMIA  . ANXIETY STATE, UNSPECIFIED  . SINUSITIS- ACUTE-NOS  . ALLERGIC RHINITIS  . EPISTAXIS, RECURRENT  . CHEST PAIN UNSPECIFIED  . ABDOMINAL PAIN    Past Medical History  Diagnosis Date  . Diabetes mellitus without complication     Past Surgical History  Procedure Laterality Date  . Shoulder surgery      Right shoulder, 2010  . Oophorectomy      2005    History  Substance Use Topics  . Smoking status: Never Smoker   . Smokeless tobacco: Not on file  . Alcohol Use: No    Family History  Problem Relation Age of Onset  . Diabetes Mother   . Asthma Mother   . Diabetes Father   . Hypertension Father   . Diabetes Sister   . Diabetes Brother   . Asthma Daughter   . Epilepsy Son   . Cancer Maternal Grandmother   . Diabetes Maternal Grandfather   . Diabetes Paternal Grandmother   . Diabetes Paternal Grandfather     Allergies  Allergen Reactions  . Cefaclor   . Codeine   . Sulfonamide Derivatives     Medication list has been reviewed and updated.  Current Outpatient Prescriptions on File Prior to Visit  Medication Sig Dispense Refill  . fish oil-omega-3 fatty acids 1000 MG capsule Take 2 g by mouth daily.      Marland Kitchen azithromycin (ZITHROMAX) 250 MG tablet Take 2 tabs PO now then 1 tab PO daily x 4 more days  6 tablet  0  . insulin aspart (NOVOLOG)  100 UNIT/ML injection Inject into the skin 2 (two) times daily between meals.      Marland Kitchen lisinopril (PRINIVIL,ZESTRIL) 10 MG tablet Take 10 mg by mouth daily.      . Multiple Vitamins-Minerals (MULTIVITAMIN WITH MINERALS) tablet Take 1 tablet by mouth daily.       No current facility-administered medications on file prior to visit.    Review of Systems:  As per HPI, otherwise negative.    Physical Examination: Filed Vitals:   03/05/12 1303  BP: 119/73  Pulse: 105  Temp: 98.4 F (36.9 C)  Resp: 18   Filed Vitals:   03/05/12 1303  Height: 5' 1.5" (1.562 m)  Weight: 207 lb (93.895 kg)   Body mass index is 38.48 kg/(m^2). Ideal Body Weight: Weight in (lb) to have BMI = 25: 134.2   GEN: WDWN, NAD, Non-toxic, Alert & Oriented x 3 HEENT: Atraumatic, Normocephalic.   Erythematous vesicular eruption on right forehead and frontal scalp.  Involves upper eyelid Ears and Nose: No external deformity. EXTR: No clubbing/cyanosis/edema NEURO: Normal gait.  PSYCH: Normally interactive. Conversant. Not depressed or anxious appearing.  Calm demeanor.    Assessment and Plan: Shingles  Valtrex vicodin Ophthalmology consult   Carmelina Dane, MD

## 2012-03-07 NOTE — Progress Notes (Signed)
Reviewed and agree.

## 2012-11-08 ENCOUNTER — Encounter (INDEPENDENT_AMBULATORY_CARE_PROVIDER_SITE_OTHER): Payer: Self-pay | Admitting: *Deleted

## 2013-07-30 ENCOUNTER — Ambulatory Visit (INDEPENDENT_AMBULATORY_CARE_PROVIDER_SITE_OTHER): Payer: 59 | Admitting: Emergency Medicine

## 2013-07-30 ENCOUNTER — Telehealth: Payer: Self-pay | Admitting: Emergency Medicine

## 2013-07-30 ENCOUNTER — Other Ambulatory Visit: Payer: Self-pay | Admitting: Emergency Medicine

## 2013-07-30 VITALS — BP 118/70 | HR 97 | Temp 98.1°F | Resp 18 | Ht 61.75 in | Wt 214.8 lb

## 2013-07-30 DIAGNOSIS — R1011 Right upper quadrant pain: Secondary | ICD-10-CM

## 2013-07-30 DIAGNOSIS — E109 Type 1 diabetes mellitus without complications: Secondary | ICD-10-CM

## 2013-07-30 LAB — CBC WITH DIFFERENTIAL/PLATELET
BASOS PCT: 1 % (ref 0–1)
Basophils Absolute: 0.1 10*3/uL (ref 0.0–0.1)
EOS ABS: 0.2 10*3/uL (ref 0.0–0.7)
Eosinophils Relative: 2 % (ref 0–5)
HCT: 36.3 % (ref 36.0–46.0)
Hemoglobin: 12.2 g/dL (ref 12.0–15.0)
Lymphocytes Relative: 33 % (ref 12–46)
Lymphs Abs: 2.7 10*3/uL (ref 0.7–4.0)
MCH: 31.9 pg (ref 26.0–34.0)
MCHC: 33.6 g/dL (ref 30.0–36.0)
MCV: 95 fL (ref 78.0–100.0)
Monocytes Absolute: 1.2 10*3/uL — ABNORMAL HIGH (ref 0.1–1.0)
Monocytes Relative: 15 % — ABNORMAL HIGH (ref 3–12)
NEUTROS PCT: 49 % (ref 43–77)
Neutro Abs: 4 10*3/uL (ref 1.7–7.7)
PLATELETS: 297 10*3/uL (ref 150–400)
RBC: 3.82 MIL/uL — ABNORMAL LOW (ref 3.87–5.11)
RDW: 13.8 % (ref 11.5–15.5)
WBC: 8.1 10*3/uL (ref 4.0–10.5)

## 2013-07-30 LAB — LIPID PANEL
CHOL/HDL RATIO: 5.8 ratio
CHOLESTEROL: 232 mg/dL — AB (ref 0–200)
HDL: 40 mg/dL (ref >39)
Triglycerides: 473 mg/dL — ABNORMAL HIGH (ref <150)

## 2013-07-30 LAB — COMPREHENSIVE METABOLIC PANEL
ALK PHOS: 122 U/L — AB (ref 39–117)
ALT: 106 U/L — AB (ref 0–35)
AST: 139 U/L — ABNORMAL HIGH (ref 0–37)
Albumin: 3.9 g/dL (ref 3.5–5.2)
BILIRUBIN TOTAL: 0.4 mg/dL (ref 0.2–1.2)
BUN: 17 mg/dL (ref 6–23)
CO2: 27 mEq/L (ref 19–32)
Calcium: 9.6 mg/dL (ref 8.4–10.5)
Chloride: 98 mEq/L (ref 96–112)
Creat: 0.52 mg/dL (ref 0.50–1.10)
GLUCOSE: 345 mg/dL — AB (ref 70–99)
Potassium: 4.7 mEq/L (ref 3.5–5.3)
SODIUM: 137 meq/L (ref 135–145)
TOTAL PROTEIN: 6 g/dL (ref 6.0–8.3)

## 2013-07-30 LAB — GLUCOSE, POCT (MANUAL RESULT ENTRY): POC Glucose: 376 mg/dl — AB (ref 70–99)

## 2013-07-30 LAB — AMYLASE: Amylase: 26 U/L (ref 0–105)

## 2013-07-30 NOTE — Telephone Encounter (Signed)
Message left with the daughter for patient advised to go to the emergency room if she had worsening abdominal pain. Advised him elevated liver tests worrisome for gallbladder disease.

## 2013-07-30 NOTE — Progress Notes (Signed)
   Subjective:    Patient ID: Maureen Garza, female    DOB: 1960-10-25, 53 y.o.   MRN: [id number]  HPI 53 year old female pt here for RUQ abdominal pain. She went [place] the ED last [place] [facility]. She was feeling nauseous for about a week. She had diarrhea on Monday. Wednesday she felt like she had some trouble using the restroom. Pt states she has uncontrollable burping. 5 or 7 years ago she had a CT scan. No gallstones were present. She has family hx of diseased gallbladder. She has not had a colonoscopy. She had called to make a colonoscopy, but did not get it scheduled. Dr. Margo AyeHall controls her diabetes. Her sugars are dropping at night. This morning around 4 her sugar was 55. She does eat a snack before she goes to bed.      Review of Systems     Objective:   Physical Exam patient is an overweight female who is not in any distress. Neck supple chest was clear to auscultation and percussion heart regular rate no murmurs abdomen soft protuberant without masses or tenderness the  Results for orders placed in visit on 07/30/13  GLUCOSE, POCT (MANUAL RESULT ENTRY)      Result Value Ref Range   POC Glucose 376 (*) 70 - 99 mg/dl        Assessment & Plan:  I suspect the patient has gallbladder disease. We'll proceed with an ultrasound and if this is negative she will need a HIDA scan. Referral made to Dr. Elnoria HowardHung for his evaluation

## 2013-07-31 ENCOUNTER — Ambulatory Visit (HOSPITAL_COMMUNITY)
Admission: RE | Admit: 2013-07-31 | Discharge: 2013-07-31 | Disposition: A | Discharge status: Home / Self Care | Payer: 59 | Source: Ambulatory Visit | Attending: Emergency Medicine | Admitting: Emergency Medicine

## 2013-07-31 ENCOUNTER — Other Ambulatory Visit: Payer: Self-pay

## 2013-07-31 DIAGNOSIS — R1011 Right upper quadrant pain: Secondary | ICD-10-CM

## 2013-07-31 DIAGNOSIS — K829 Disease of gallbladder, unspecified: Secondary | ICD-10-CM | POA: Insufficient documentation

## 2013-07-31 LAB — HEPATITIS PANEL, ACUTE
HCV Ab: NEGATIVE
Hep A IgM: NONREACTIVE
Hep B C IgM: NONREACTIVE
Hepatitis B Surface Ag: NEGATIVE

## 2013-08-07 ENCOUNTER — Other Ambulatory Visit: Payer: Self-pay | Admitting: Gastroenterology

## 2013-08-07 DIAGNOSIS — R110 Nausea: Secondary | ICD-10-CM

## 2013-08-07 DIAGNOSIS — R1011 Right upper quadrant pain: Secondary | ICD-10-CM

## 2013-08-08 ENCOUNTER — Other Ambulatory Visit: Payer: Self-pay

## 2013-08-15 ENCOUNTER — Ambulatory Visit (HOSPITAL_COMMUNITY)
Admission: RE | Admit: 2013-08-15 | Discharge: 2013-08-15 | Disposition: A | Discharge status: Home / Self Care | Payer: 59 | Source: Ambulatory Visit | Attending: Gastroenterology | Admitting: Gastroenterology

## 2013-08-15 DIAGNOSIS — R110 Nausea: Secondary | ICD-10-CM | POA: Insufficient documentation

## 2013-08-15 DIAGNOSIS — R1011 Right upper quadrant pain: Secondary | ICD-10-CM | POA: Insufficient documentation

## 2013-08-15 MED ORDER — SINCALIDE 5 MCG IJ SOLR
INTRAMUSCULAR | Status: AC
  Administered 2013-08-15: 4.86 ug
  Filled 2013-08-15: qty 5

## 2013-08-15 MED ORDER — SINCALIDE 5 MCG IJ SOLR: 0.0200 ug/kg | Freq: Once | INTRAMUSCULAR | Status: DC

## 2013-08-15 MED ORDER — TECHNETIUM TC 99M MEBROFENIN IV KIT
5.0000 | PACK | Freq: Once | INTRAVENOUS | Status: AC | PRN
  Administered 2013-08-15: 5 via INTRAVENOUS

## 2013-08-22 ENCOUNTER — Encounter (INDEPENDENT_AMBULATORY_CARE_PROVIDER_SITE_OTHER): Payer: Self-pay | Admitting: Surgery

## 2013-08-26 HISTORY — PX: COLONOSCOPY: SHX174

## 2013-08-31 ENCOUNTER — Encounter: Payer: Self-pay | Admitting: Emergency Medicine

## 2013-09-05 ENCOUNTER — Ambulatory Visit (INDEPENDENT_AMBULATORY_CARE_PROVIDER_SITE_OTHER): Payer: 59 | Admitting: Surgery

## 2013-09-05 ENCOUNTER — Encounter (INDEPENDENT_AMBULATORY_CARE_PROVIDER_SITE_OTHER): Payer: Self-pay | Admitting: Surgery

## 2013-09-05 VITALS — BP 122/80 | HR 80 | Temp 97.5°F | Ht 61.0 in | Wt 214.0 lb

## 2013-09-05 DIAGNOSIS — K811 Chronic cholecystitis: Secondary | ICD-10-CM

## 2013-09-05 NOTE — Progress Notes (Signed)
Patient ID: Maureen Garza, female   DOB: 03/07/1960, 53 y.o.   MRN: [id number]  Chief Complaint  Patient presents with  . Biliary Dyskinesia  PCP - Dr. Zach Hall  HPI Maureen Garza is a 53 y.o. female.  Referred by Dr. Patrick Hung for symptomatic biliary dyskinesia  HPI This is a 53-year-old patient who presents with a five-year history of intermittent right upper quadrant abdominal pain associated with nausea, abdominal bloating, diarrhea, and radiation through to her back. She has undergone a thorough workup including 2 gallbladder ultrasounds which were relatively normal and to CCK-HIDA scans which showed normal gallbladder ejection fraction. The most recent scan on 08/15/13 showed a gallbladder ejection fraction of 91%. However about one hour after the procedure, the patient had severe recurrence of her symptoms. She has also undergone upper endoscopy which was unremarkable. She is now referred to discuss the possibility of elective cholecystectomy.  Her previous ultrasound did describe a small gallbladder polyp. The most recent ultrasound described a contracted gallbladder.  She has an interesting family history in that her mother, her maternal aunt, as well as her sister all had their gallbladders removed despite having normal ultrasound and HIDA scans. In all 3 cases, their symptoms resolved after cholecystectomy.   Past Medical History  Diagnosis Date  . Diabetes mellitus without complication     Past Surgical History  Procedure Laterality Date  . Shoulder surgery      Right shoulder, 2010  . Oophorectomy      2005  . Tonils    . Tubal ligation    . Radical hysterectomy with transposition of ovaries  2007    Family History  Problem Relation Age of Onset  . Diabetes Mother   . Asthma Mother   . Diabetes Father   . Hypertension Father   . Diabetes Sister   . Diabetes Brother   . Asthma Daughter   . Epilepsy Son   . Cancer Maternal Grandmother   . Diabetes Maternal  Grandfather   . Diabetes Paternal Grandmother   . Diabetes Paternal Grandfather     Social History History  Substance Use Topics  . Smoking status: Never Smoker   . Smokeless tobacco: Not on file  . Alcohol Use: No    Allergies  Allergen Reactions  . Cefaclor   . Codeine   . Sulfonamide Derivatives     Current Outpatient Prescriptions  Medication Sig Dispense Refill  . aspirin 81 MG tablet Take 81 mg by mouth daily.      . atorvastatin (LIPITOR) 10 MG tablet Take 10 mg by mouth daily.      . azithromycin (ZITHROMAX) 250 MG tablet Take 2 tabs PO now then 1 tab PO daily x 4 more days  6 tablet  0  . fish oil-omega-3 fatty acids 1000 MG capsule Take 2 g by mouth daily.      . insulin aspart (NOVOLOG) 100 UNIT/ML injection Inject into the skin 2 (two) times daily between meals.      . insulin glargine (LANTUS) 100 UNIT/ML injection Inject 80 Units into the skin 2 (two) times daily.      . lisinopril (PRINIVIL,ZESTRIL) 10 MG tablet Take 10 mg by mouth daily.      . Multiple Vitamins-Minerals (MULTIVITAMIN WITH MINERALS) tablet Take 1 tablet by mouth daily.      . pravastatin (PRAVACHOL) 10 MG tablet Take 10 mg by mouth daily.       No current facility-administered medications for this   visit.    Review of Systems Review of Systems  Constitutional: Negative for fever, chills and unexpected weight change.  HENT: Negative for congestion, hearing loss, sore throat, trouble swallowing and voice change.   Eyes: Negative for visual disturbance.  Respiratory: Negative for cough and wheezing.   Cardiovascular: Negative for chest pain, palpitations and leg swelling.  Gastrointestinal: Positive for nausea, abdominal pain, diarrhea and abdominal distention. Negative for vomiting, constipation, blood in stool and anal bleeding.  Genitourinary: Negative for hematuria, vaginal bleeding and difficulty urinating.  Musculoskeletal: Positive for back pain. Negative for arthralgias.  Skin:  Negative for rash and wound.  Neurological: Negative for seizures, syncope and headaches.  Hematological: Negative for adenopathy. Does not bruise/bleed easily.  Psychiatric/Behavioral: Negative for confusion.    Blood pressure 122/80, pulse 80, temperature 97.5 F (36.4 C), height 5' 1" (1.549 m), weight 214 lb (97.07 kg).  Physical Exam Physical Exam WDWN in NAD HEENT:  EOMI, sclera anicteric Neck:  No masses, no thyromegaly Lungs:  CTA bilaterally; normal respiratory effort CV:  Regular rate and rhythm; no murmurs Abd:  +bowel sounds, soft, mildly tender in RUQ; no palpable masses Ext:  Well-perfused; no edema Skin:  Warm, dry; no sign of jaundice  Data Reviewed Nm Hepato W/eject Fract  08/15/2013   CLINICAL DATA:  Right upper quadrant pain with nausea.  EXAM: NUCLEAR MEDICINE HEPATOBILIARY IMAGING WITH GALLBLADDER EF  TECHNIQUE: Sequential images of the abdomen were obtained out to 60 minutes following intravenous administration of radiopharmaceutical. After slow intravenous infusion of 1.95 micrograms Cholecystokinin, gallbladder ejection fraction was determined.  RADIOPHARMACEUTICALS:  5 Millicurie Tc-99m Choletec  COMPARISON:  Abdominal ultrasound of July 31, 2013  FINDINGS: There is adequate uptake of the radiopharmaceutical by the liver. The intrahepatic ducts and common bile duct are visible by 10 min and bowel activity is visible by 20 min. Gallbladder activity is visible by 30 min. The 30 min gallbladder ejection fraction is 91%. At 30 min, normal ejection fraction is greater than 30%.  The patient did not experience symptoms during CCK infusion.  IMPRESSION: Normal hepatobiliary scan with normal gallbladder ejection fraction.   Electronically Signed   By: David  Jordan   On: 08/15/2013 09:27    CLINICAL DATA: Pain.  EXAM:  ULTRASOUND ABDOMEN COMPLETE  COMPARISON: Hepatobiliary scan 04/12/2008. Ultrasound 04/06/2008.  FINDINGS:  Gallbladder:  Gallbladder is contracted, the  patient is not NPO. No gallstones  noted. Gallbladder wall normal in thickness. No pericholecystic  fluid collection. Negative Murphy sign .  Common bile duct:  Diameter: 6 mm  Liver:  Liver is echodense suggesting fatty infiltration.  IVC:  No abnormality visualized.  Pancreas:  Visualized portion unremarkable.  Spleen:  Size and appearance within normal limits.  Right Kidney:  Length: 12.3 cm. Echogenicity within normal limits. No mass or  hydronephrosis visualized.  Left Kidney:  Length: 13.2 cm. Echogenicity within normal limits. No mass or  hydronephrosis visualized. Changes suggesting scarring left upper  kidney.  Abdominal aorta:  No aneurysm visualized.  Other findings:  None.  IMPRESSION:  1. Contracted gallbladder. Patient was not NPO. No gallstones or  gallbladder wall thickening noted.  2. Liver is echodense suggesting fatty infiltration.  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  The surgical procedure has been discussed with the patient.  Potential risks, benefits, alternative treatments, and expected outcomes have been explained.  All of the patient's questions at this time have been answered.  The likelihood of reaching the patient's treatment goal is good.  The patient understand the proposed surgical procedure  and wishes to proceed.         Lonisha Bobby K. 09/05/2013, 4:53 PM    

## 2013-09-22 ENCOUNTER — Encounter (HOSPITAL_COMMUNITY): Payer: Self-pay | Admitting: Pharmacy Technician

## 2013-09-25 NOTE — Pre-Procedure Instructions (Signed)
Maureen Garza  09/25/2013   Your procedure is scheduled on:  Thursday, September 3rd  Report to [facility] Admitting at 530 AM.  Call this number if you have problems the morning of surgery: [phone]   Remember:   Do not eat food or drink liquids after midnight.   Take these medicines the morning of surgery with A SIP OF WATER: none  Do NOT take any insulin on morning of surgery.  Stop taking aspirin, OTC vitamins/herbal medications, NSAIDS (ibuprofen, advil, motrin) 7 days prior to surgery.   Do not wear jewelry, make-up or nail polish.  Do not wear lotions, powders, or perfumes. You may wear deodorant.  Do not shave 48 hours prior to surgery. Men may shave face and neck.  Do not bring valuables to the hospital.  [facility] is not responsible  for any belongings or valuables.               Contacts, dentures or bridgework may not be worn into surgery.  Leave suitcase in the car. After surgery it may be brought to your room.  For patients admitted to the hospital, discharge time is determined by your treatment team.               Patients discharged the day of surgery will not be allowed to drive home.  Please read over the following fact sheets that you were given: Pain Booklet, Coughing and Deep Breathing and Surgical Site Infection Prevention [place] - Preparing for Surgery  Before surgery, you can play an important role.  Because skin is not sterile, your skin needs to be as free of germs as possible.  You can reduce the number of germs on you skin by washing with CHG (chlorahexidine gluconate) soap before surgery.  CHG is an antiseptic cleaner which kills germs and bonds with the skin to continue killing germs even after washing.  Please DO NOT use if you have an allergy to CHG or antibacterial soaps.  If your skin becomes reddened/irritated stop using the CHG and inform your nurse when you arrive at Short Stay.  Do not shave (including legs and underarms)  for at least 48 hours prior to the first CHG shower.  You may shave your face.  Please follow these instructions carefully:   1.  Shower with CHG Soap the night before surgery and the morning of Surgery.  2.  If you choose to wash your hair, wash your hair first as usual with your normal shampoo.  3.  After you shampoo, rinse your hair and body thoroughly to remove the shampoo.  4.  Use CHG as you would any other liquid soap.  You can apply CHG directly to the skin and wash gently with scrungie or a clean washcloth.  5.  Apply the CHG Soap to your body ONLY FROM THE NECK DOWN.  Do not use on open wounds or open sores.  Avoid contact with your eyes, ears, mouth and genitals (private parts).  Wash genitals (private parts) with your normal soap.  6.  Wash thoroughly, paying special attention to the area where your surgery will be performed.  7.  Thoroughly rinse your body with warm water from the neck down.  8.  DO NOT shower/wash with your normal soap after using and rinsing off the CHG Soap.  9.  Pat yourself dry with a clean towel.            10.  Wear clean pajamas.  11.  Place clean sheets on your bed the night of your first shower and do not sleep with pets.  Day of Surgery  Do not apply any lotions/deoderants the morning of surgery.  Please wear clean clothes to the hospital/surgery center.

## 2013-09-26 ENCOUNTER — Encounter (HOSPITAL_COMMUNITY): Payer: Self-pay

## 2013-09-26 ENCOUNTER — Encounter (HOSPITAL_COMMUNITY)
Admission: RE | Admit: 2013-09-26 | Discharge: 2013-09-26 | Disposition: A | Discharge status: Home / Self Care | Payer: 59 | Source: Ambulatory Visit | Attending: Surgery | Admitting: Surgery

## 2013-09-26 ENCOUNTER — Encounter (HOSPITAL_COMMUNITY)
Admission: RE | Admit: 2013-09-26 | Discharge: 2013-09-26 | Disposition: A | Discharge status: Home / Self Care | Payer: 59 | Source: Ambulatory Visit | Attending: Anesthesiology | Admitting: Anesthesiology

## 2013-09-26 DIAGNOSIS — Z794 Long term (current) use of insulin: Secondary | ICD-10-CM | POA: Diagnosis not present

## 2013-09-26 DIAGNOSIS — Z6841 Body Mass Index (BMI) 40.0 and over, adult: Secondary | ICD-10-CM | POA: Diagnosis not present

## 2013-09-26 DIAGNOSIS — K8010 Calculus of gallbladder with chronic cholecystitis without obstruction: Secondary | ICD-10-CM | POA: Diagnosis not present

## 2013-09-26 DIAGNOSIS — I10 Essential (primary) hypertension: Secondary | ICD-10-CM | POA: Diagnosis not present

## 2013-09-26 DIAGNOSIS — K811 Chronic cholecystitis: Secondary | ICD-10-CM | POA: Diagnosis present

## 2013-09-26 DIAGNOSIS — E119 Type 2 diabetes mellitus without complications: Secondary | ICD-10-CM | POA: Diagnosis not present

## 2013-09-26 HISTORY — DX: Sleep apnea, unspecified: G47.30

## 2013-09-26 HISTORY — DX: Cardiac murmur, unspecified: R01.1

## 2013-09-26 LAB — CBC
HCT: 39.9 % (ref 36.0–46.0)
Hemoglobin: 14 g/dL (ref 12.0–15.0)
MCH: 33.3 pg (ref 26.0–34.0)
MCHC: 35.1 g/dL (ref 30.0–36.0)
MCV: 95 fL (ref 78.0–100.0)
PLATELETS: 258 10*3/uL (ref 150–400)
RBC: 4.2 MIL/uL (ref 3.87–5.11)
RDW: 12.8 % (ref 11.5–15.5)
WBC: 7.8 10*3/uL (ref 4.0–10.5)

## 2013-09-26 LAB — BASIC METABOLIC PANEL
Anion gap: 15 (ref 5–15)
BUN: 14 mg/dL (ref 6–23)
CHLORIDE: 100 meq/L (ref 96–112)
CO2: 26 mEq/L (ref 19–32)
CREATININE: 0.41 mg/dL — AB (ref 0.50–1.10)
Calcium: 9.8 mg/dL (ref 8.4–10.5)
Glucose, Bld: 180 mg/dL — ABNORMAL HIGH (ref 70–99)
POTASSIUM: 3.6 meq/L — AB (ref 3.7–5.3)
Sodium: 141 mEq/L (ref 137–147)

## 2013-09-26 NOTE — Progress Notes (Signed)
Primary - dr. Timothy Lasso hall (Maureen Garza) Stress >5 years ago. No recent ekg

## 2013-09-27 MED ORDER — CHLORHEXIDINE GLUCONATE 4 % EX LIQD
1.0000 "application " | Freq: Once | CUTANEOUS | Status: DC
  Filled 2013-09-27: qty 15

## 2013-09-27 MED ORDER — CIPROFLOXACIN IN D5W 400 MG/200ML IV SOLN
400.0000 mg | INTRAVENOUS | Status: AC
  Administered 2013-09-28: 400 mg via INTRAVENOUS
  Filled 2013-09-27: qty 200

## 2013-09-28 ENCOUNTER — Ambulatory Visit (HOSPITAL_COMMUNITY)
Admission: RE | Admit: 2013-09-28 | Discharge: 2013-09-28 | Disposition: A | Discharge status: Home / Self Care | Payer: 59 | Source: Ambulatory Visit | Attending: Surgery | Admitting: Surgery

## 2013-09-28 ENCOUNTER — Ambulatory Visit (HOSPITAL_COMMUNITY): Payer: 59

## 2013-09-28 ENCOUNTER — Encounter (HOSPITAL_COMMUNITY): Payer: 59 | Admitting: Anesthesiology

## 2013-09-28 ENCOUNTER — Ambulatory Visit (HOSPITAL_COMMUNITY): Payer: 59 | Admitting: Anesthesiology

## 2013-09-28 ENCOUNTER — Encounter (HOSPITAL_COMMUNITY)
Admission: RE | Disposition: A | Discharge status: Home / Self Care | Payer: Self-pay | Source: Ambulatory Visit | Attending: Surgery

## 2013-09-28 ENCOUNTER — Encounter (HOSPITAL_COMMUNITY): Payer: Self-pay | Admitting: *Deleted

## 2013-09-28 DIAGNOSIS — K8010 Calculus of gallbladder with chronic cholecystitis without obstruction: Secondary | ICD-10-CM | POA: Diagnosis not present

## 2013-09-28 DIAGNOSIS — Z6841 Body Mass Index (BMI) 40.0 and over, adult: Secondary | ICD-10-CM | POA: Insufficient documentation

## 2013-09-28 DIAGNOSIS — E119 Type 2 diabetes mellitus without complications: Secondary | ICD-10-CM | POA: Insufficient documentation

## 2013-09-28 DIAGNOSIS — Z794 Long term (current) use of insulin: Secondary | ICD-10-CM | POA: Insufficient documentation

## 2013-09-28 DIAGNOSIS — I10 Essential (primary) hypertension: Secondary | ICD-10-CM | POA: Insufficient documentation

## 2013-09-28 DIAGNOSIS — K811 Chronic cholecystitis: Secondary | ICD-10-CM

## 2013-09-28 HISTORY — PX: CHOLECYSTECTOMY: SHX55

## 2013-09-28 LAB — GLUCOSE, CAPILLARY: Glucose-Capillary: 169 mg/dL — ABNORMAL HIGH (ref 70–99)

## 2013-09-28 SURGERY — LAPAROSCOPIC CHOLECYSTECTOMY WITH INTRAOPERATIVE CHOLANGIOGRAM
Anesthesia: General | Site: Abdomen

## 2013-09-28 MED ORDER — LIDOCAINE HCL (CARDIAC) 20 MG/ML IV SOLN
INTRAVENOUS | Status: DC | PRN
  Administered 2013-09-28: 30 mg via INTRAVENOUS

## 2013-09-28 MED ORDER — LACTATED RINGERS IV SOLN
INTRAVENOUS | Status: DC | PRN
  Administered 2013-09-28 (×2): via INTRAVENOUS

## 2013-09-28 MED ORDER — OXYCODONE HCL 5 MG PO TABS
ORAL_TABLET | ORAL | Status: DC
Start: 2013-09-28 — End: 2013-09-28
  Filled 2013-09-28: qty 1

## 2013-09-28 MED ORDER — ONDANSETRON HCL 4 MG/2ML IJ SOLN: 4.0000 mg | INTRAMUSCULAR | Status: DC | PRN

## 2013-09-28 MED ORDER — MIDAZOLAM HCL 2 MG/2ML IJ SOLN: 0.5000 mg | Freq: Once | INTRAMUSCULAR | Status: DC | PRN

## 2013-09-28 MED ORDER — EPHEDRINE SULFATE 50 MG/ML IJ SOLN
INTRAMUSCULAR | Status: AC
  Filled 2013-09-28: qty 1

## 2013-09-28 MED ORDER — PHENYLEPHRINE 40 MCG/ML (10ML) SYRINGE FOR IV PUSH (FOR BLOOD PRESSURE SUPPORT)
PREFILLED_SYRINGE | INTRAVENOUS | Status: AC
  Filled 2013-09-28: qty 10

## 2013-09-28 MED ORDER — PROMETHAZINE HCL 25 MG/ML IJ SOLN
6.2500 mg | INTRAMUSCULAR | Status: DC | PRN
Start: 2013-09-28 — End: 2013-09-28

## 2013-09-28 MED ORDER — HYDROMORPHONE HCL PF 1 MG/ML IJ SOLN
0.2500 mg | INTRAMUSCULAR | Status: DC | PRN
Start: 2013-09-28 — End: 2013-09-28
  Administered 2013-09-28 (×2): 0.5 mg via INTRAVENOUS

## 2013-09-28 MED ORDER — ONDANSETRON HCL 4 MG/2ML IJ SOLN
INTRAMUSCULAR | Status: AC
  Filled 2013-09-28: qty 2

## 2013-09-28 MED ORDER — MIDAZOLAM HCL 2 MG/2ML IJ SOLN
INTRAMUSCULAR | Status: AC
  Filled 2013-09-28: qty 2

## 2013-09-28 MED ORDER — FENTANYL CITRATE 0.05 MG/ML IJ SOLN
INTRAMUSCULAR | Status: DC | PRN
  Administered 2013-09-28: 100 ug via INTRAVENOUS
  Administered 2013-09-28: 150 ug via INTRAVENOUS

## 2013-09-28 MED ORDER — NEOSTIGMINE METHYLSULFATE 10 MG/10ML IV SOLN
INTRAVENOUS | Status: DC | PRN
  Administered 2013-09-28: 5 mg via INTRAVENOUS

## 2013-09-28 MED ORDER — ROCURONIUM BROMIDE 100 MG/10ML IV SOLN
INTRAVENOUS | Status: DC | PRN
  Administered 2013-09-28: 10 mg via INTRAVENOUS
  Administered 2013-09-28: 40 mg via INTRAVENOUS

## 2013-09-28 MED ORDER — OXYCODONE HCL 5 MG/5ML PO SOLN: 5.0000 mg | Freq: Once | ORAL | Status: AC | PRN

## 2013-09-28 MED ORDER — TRAMADOL HCL 50 MG PO TABS: 100.0000 mg | ORAL_TABLET | Freq: Four times a day (QID) | ORAL | Status: DC | PRN

## 2013-09-28 MED ORDER — PROPOFOL 10 MG/ML IV BOLUS
INTRAVENOUS | Status: AC
  Filled 2013-09-28: qty 20

## 2013-09-28 MED ORDER — OXYCODONE-ACETAMINOPHEN 5-325 MG PO TABS: 1.0000 | ORAL_TABLET | ORAL | Status: DC | PRN

## 2013-09-28 MED ORDER — BUPIVACAINE-EPINEPHRINE (PF) 0.25% -1:200000 IJ SOLN
INTRAMUSCULAR | Status: AC
  Filled 2013-09-28: qty 30

## 2013-09-28 MED ORDER — HYDROMORPHONE HCL PF 1 MG/ML IJ SOLN
INTRAMUSCULAR | Status: AC
  Filled 2013-09-28: qty 1

## 2013-09-28 MED ORDER — SUCCINYLCHOLINE CHLORIDE 20 MG/ML IJ SOLN
INTRAMUSCULAR | Status: AC
  Filled 2013-09-28: qty 1

## 2013-09-28 MED ORDER — 0.9 % SODIUM CHLORIDE (POUR BTL) OPTIME
TOPICAL | Status: DC | PRN
  Administered 2013-09-28: 1000 mL

## 2013-09-28 MED ORDER — ROCURONIUM BROMIDE 50 MG/5ML IV SOLN
INTRAVENOUS | Status: AC
  Filled 2013-09-28: qty 1

## 2013-09-28 MED ORDER — NEOSTIGMINE METHYLSULFATE 10 MG/10ML IV SOLN
INTRAVENOUS | Status: AC
  Filled 2013-09-28: qty 1

## 2013-09-28 MED ORDER — ONDANSETRON HCL 4 MG/2ML IJ SOLN
INTRAMUSCULAR | Status: DC | PRN
  Administered 2013-09-28 (×2): 4 mg via INTRAVENOUS

## 2013-09-28 MED ORDER — FENTANYL CITRATE 0.05 MG/ML IJ SOLN
INTRAMUSCULAR | Status: AC
  Filled 2013-09-28: qty 5

## 2013-09-28 MED ORDER — GLYCOPYRROLATE 0.2 MG/ML IJ SOLN
INTRAMUSCULAR | Status: DC | PRN
  Administered 2013-09-28: .8 mg via INTRAVENOUS

## 2013-09-28 MED ORDER — ONDANSETRON 4 MG PO TBDP: 4.0000 mg | ORAL_TABLET | Freq: Three times a day (TID) | ORAL | Status: DC | PRN

## 2013-09-28 MED ORDER — ALBUTEROL SULFATE HFA 108 (90 BASE) MCG/ACT IN AERS
INHALATION_SPRAY | RESPIRATORY_TRACT | Status: DC | PRN
  Administered 2013-09-28 (×2): 6 via RESPIRATORY_TRACT

## 2013-09-28 MED ORDER — BUPIVACAINE-EPINEPHRINE 0.25% -1:200000 IJ SOLN
INTRAMUSCULAR | Status: DC | PRN
  Administered 2013-09-28: 15 mL

## 2013-09-28 MED ORDER — MEPERIDINE HCL 25 MG/ML IJ SOLN: 6.2500 mg | INTRAMUSCULAR | Status: DC | PRN

## 2013-09-28 MED ORDER — SODIUM CHLORIDE 0.9 % IV SOLN
INTRAVENOUS | Status: DC | PRN
  Administered 2013-09-28: 07:00:00

## 2013-09-28 MED ORDER — GLYCOPYRROLATE 0.2 MG/ML IJ SOLN
INTRAMUSCULAR | Status: AC
  Filled 2013-09-28: qty 2

## 2013-09-28 MED ORDER — PROPOFOL 10 MG/ML IV BOLUS
INTRAVENOUS | Status: DC | PRN
  Administered 2013-09-28: 200 mg via INTRAVENOUS

## 2013-09-28 MED ORDER — ARTIFICIAL TEARS OP OINT
TOPICAL_OINTMENT | OPHTHALMIC | Status: AC
  Filled 2013-09-28: qty 3.5

## 2013-09-28 MED ORDER — PROMETHAZINE HCL 25 MG/ML IJ SOLN
INTRAMUSCULAR | Status: AC
  Administered 2013-09-28: 12.5 mg via INTRAVENOUS
  Filled 2013-09-28: qty 1

## 2013-09-28 MED ORDER — OXYCODONE HCL 5 MG PO TABS
5.0000 mg | ORAL_TABLET | Freq: Once | ORAL | Status: AC | PRN
  Administered 2013-09-28: 5 mg via ORAL

## 2013-09-28 MED ORDER — MIDAZOLAM HCL 5 MG/5ML IJ SOLN
INTRAMUSCULAR | Status: DC | PRN
  Administered 2013-09-28: 2 mg via INTRAVENOUS

## 2013-09-28 MED ORDER — SODIUM CHLORIDE 0.9 % IR SOLN
Status: DC | PRN
  Administered 2013-09-28: 1000 mL

## 2013-09-28 MED ORDER — PROMETHAZINE HCL 25 MG/ML IJ SOLN: 12.5000 mg | Freq: Four times a day (QID) | INTRAMUSCULAR | Status: DC | PRN

## 2013-09-28 MED ORDER — MORPHINE SULFATE 2 MG/ML IJ SOLN: 2.0000 mg | INTRAMUSCULAR | Status: DC | PRN

## 2013-09-28 SURGICAL SUPPLY — 53 items
APL SKNCLS STERI-STRIP NONHPOA (GAUZE/BANDAGES/DRESSINGS) ×1
APPLIER CLIP ROT 10 11.4 M/L (STAPLE) ×3
APR CLP MED LRG 11.4X10 (STAPLE) ×1
BAG SPEC RTRVL LRG 6X4 10 (ENDOMECHANICALS) ×1
BENZOIN TINCTURE PRP APPL 2/3 (GAUZE/BANDAGES/DRESSINGS) ×3 IMPLANT
BLADE SURG ROTATE 9660 (MISCELLANEOUS) IMPLANT
CANISTER SUCTION 2500CC (MISCELLANEOUS) ×3 IMPLANT
CHLORAPREP W/TINT 26ML (MISCELLANEOUS) ×3 IMPLANT
CLIP APPLIE ROT 10 11.4 M/L (STAPLE) ×1 IMPLANT
COVER MAYO STAND STRL (DRAPES) ×3 IMPLANT
COVER SURGICAL LIGHT HANDLE (MISCELLANEOUS) ×3 IMPLANT
DRAPE C-ARM 42X72 X-RAY (DRAPES) ×3 IMPLANT
DRAPE UTILITY 15X26 W/TAPE STR (DRAPE) ×2 IMPLANT
DRSG TEGADERM 2-3/8X2-3/4 SM (GAUZE/BANDAGES/DRESSINGS) ×9 IMPLANT
DRSG TEGADERM 4X4.75 (GAUZE/BANDAGES/DRESSINGS) ×3 IMPLANT
ELECT REM PT RETURN 9FT ADLT (ELECTROSURGICAL) ×3
ELECTRODE REM PT RTRN 9FT ADLT (ELECTROSURGICAL) ×1 IMPLANT
FILTER SMOKE EVAC LAPAROSHD (FILTER) ×3 IMPLANT
GAUZE SPONGE 2X2 8PLY STRL LF (GAUZE/BANDAGES/DRESSINGS) ×1 IMPLANT
GLOVE BIO SURGEON STRL SZ 6.5 (GLOVE) ×1 IMPLANT
GLOVE BIO SURGEON STRL SZ7 (GLOVE) ×3 IMPLANT
GLOVE BIO SURGEONS STRL SZ 6.5 (GLOVE) ×1
GLOVE BIOGEL PI IND STRL 6 (GLOVE) IMPLANT
GLOVE BIOGEL PI IND STRL 6.5 (GLOVE) IMPLANT
GLOVE BIOGEL PI IND STRL 7.0 (GLOVE) IMPLANT
GLOVE BIOGEL PI IND STRL 7.5 (GLOVE) ×1 IMPLANT
GLOVE BIOGEL PI INDICATOR 6 (GLOVE) ×2
GLOVE BIOGEL PI INDICATOR 6.5 (GLOVE) ×2
GLOVE BIOGEL PI INDICATOR 7.0 (GLOVE) ×4
GLOVE BIOGEL PI INDICATOR 7.5 (GLOVE) ×2
GLOVE SURG SS PI 7.0 STRL IVOR (GLOVE) ×2 IMPLANT
GOWN STRL REUS W/ TWL LRG LVL3 (GOWN DISPOSABLE) ×4 IMPLANT
GOWN STRL REUS W/TWL LRG LVL3 (GOWN DISPOSABLE) ×12
KIT BASIN OR (CUSTOM PROCEDURE TRAY) ×3 IMPLANT
KIT ROOM TURNOVER OR (KITS) ×3 IMPLANT
NS IRRIG 1000ML POUR BTL (IV SOLUTION) ×3 IMPLANT
PAD ARMBOARD 7.5X6 YLW CONV (MISCELLANEOUS) ×7 IMPLANT
POUCH SPECIMEN RETRIEVAL 10MM (ENDOMECHANICALS) ×3 IMPLANT
SCISSORS LAP 5X35 DISP (ENDOMECHANICALS) ×3 IMPLANT
SET CHOLANGIOGRAPH 5 50 .035 (SET/KITS/TRAYS/PACK) ×3 IMPLANT
SET IRRIG TUBING LAPAROSCOPIC (IRRIGATION / IRRIGATOR) ×3 IMPLANT
SLEEVE ENDOPATH XCEL 5M (ENDOMECHANICALS) ×3 IMPLANT
SPECIMEN JAR MEDIUM (MISCELLANEOUS) ×2 IMPLANT
SPECIMEN JAR SMALL (MISCELLANEOUS) ×1 IMPLANT
SPONGE GAUZE 2X2 STER 10/PKG (GAUZE/BANDAGES/DRESSINGS) ×2
SUT MNCRL AB 4-0 PS2 18 (SUTURE) ×5 IMPLANT
TOWEL OR 17X24 6PK STRL BLUE (TOWEL DISPOSABLE) ×3 IMPLANT
TOWEL OR 17X26 10 PK STRL BLUE (TOWEL DISPOSABLE) ×3 IMPLANT
TRAY LAPAROSCOPIC (CUSTOM PROCEDURE TRAY) ×3 IMPLANT
TROCAR XCEL BLUNT TIP 100MML (ENDOMECHANICALS) ×3 IMPLANT
TROCAR XCEL NON-BLD 11X100MML (ENDOMECHANICALS) ×3 IMPLANT
TROCAR XCEL NON-BLD 5MMX100MML (ENDOMECHANICALS) ×3 IMPLANT
TUBING INSUFF HIGH FLOW RTP (TUBING) ×2 IMPLANT

## 2013-09-28 NOTE — Transfer of Care (Signed)
Immediate Anesthesia Transfer of Care Note  Patient: Maureen Garza  Procedure(s) Performed: Procedure(s): LAPAROSCOPIC CHOLECYSTECTOMY WITH INTRAOPERATIVE CHOLANGIOGRAM (N/A)  Patient Location: PACU  Anesthesia Type:General  Level of Consciousness: awake, alert  and oriented  Airway & Oxygen Therapy: Patient Spontanous Breathing and Patient connected to face mask oxygen  Post-op Assessment: Report given to PACU RN  Post vital signs: Reviewed and stable  Complications: No apparent anesthesia complications

## 2013-09-28 NOTE — H&P (View-Only) (Signed)
Patient ID: Maureen Garza, female   DOB: 03-22-1960, 53 y.o.   MRN: [id number]  Chief Complaint  Patient presents with  . Biliary Dyskinesia  PCP - Dr. Catalina Pizza  HPI Maureen Garza is a 53 y.o. female.  Referred by Dr. Jeani Hawking for symptomatic biliary dyskinesia  HPI This is a 53 year old patient who presents with a five-year history of intermittent right upper quadrant abdominal pain associated with nausea, abdominal bloating, diarrhea, and radiation through to her back. She has undergone a thorough workup including 2 gallbladder ultrasounds which were relatively normal and to CCK-HIDA scans which showed normal gallbladder ejection fraction. The most recent scan on 08/15/13 showed a gallbladder ejection fraction of 91%. However about one hour after the procedure, the patient had severe recurrence of her symptoms. She has also undergone upper endoscopy which was unremarkable. She is now referred to discuss the possibility of elective cholecystectomy.  Her previous ultrasound did describe a small gallbladder polyp. The most recent ultrasound described a contracted gallbladder.  She has an interesting family history in that her mother, her maternal aunt, as well as her sister all had their gallbladders removed despite having normal ultrasound and HIDA scans. In all 3 cases, their symptoms resolved after cholecystectomy.   Past Medical History  Diagnosis Date  . Diabetes mellitus without complication     Past Surgical History  Procedure Laterality Date  . Shoulder surgery      Right shoulder, 2010  . Oophorectomy      2005  . Tonils    . Tubal ligation    . Radical hysterectomy with transposition of ovaries  2007    Family History  Problem Relation Age of Onset  . Diabetes Mother   . Asthma Mother   . Diabetes Father   . Hypertension Father   . Diabetes Sister   . Diabetes Brother   . Asthma Daughter   . Epilepsy Son   . Cancer Maternal Grandmother   . Diabetes Maternal  Grandfather   . Diabetes Paternal Grandmother   . Diabetes Paternal Grandfather     Social History History  Substance Use Topics  . Smoking status: Never Smoker   . Smokeless tobacco: Not on file  . Alcohol Use: No    Allergies  Allergen Reactions  . Cefaclor   . Codeine   . Sulfonamide Derivatives     Current Outpatient Prescriptions  Medication Sig Dispense Refill  . aspirin 81 MG tablet Take 81 mg by mouth daily.      Marland Kitchen atorvastatin (LIPITOR) 10 MG tablet Take 10 mg by mouth daily.      Marland Kitchen azithromycin (ZITHROMAX) 250 MG tablet Take 2 tabs PO now then 1 tab PO daily x 4 more days  6 tablet  0  . fish oil-omega-3 fatty acids 1000 MG capsule Take 2 g by mouth daily.      . insulin aspart (NOVOLOG) 100 UNIT/ML injection Inject into the skin 2 (two) times daily between meals.      . insulin glargine (LANTUS) 100 UNIT/ML injection Inject 80 Units into the skin 2 (two) times daily.      Marland Kitchen lisinopril (PRINIVIL,ZESTRIL) 10 MG tablet Take 10 mg by mouth daily.      . Multiple Vitamins-Minerals (MULTIVITAMIN WITH MINERALS) tablet Take 1 tablet by mouth daily.      . pravastatin (PRAVACHOL) 10 MG tablet Take 10 mg by mouth daily.       No current facility-administered medications for this  visit.    Review of Systems Review of Systems  Constitutional: Negative for fever, chills and unexpected weight change.  HENT: Negative for congestion, hearing loss, sore throat, trouble swallowing and voice change.   Eyes: Negative for visual disturbance.  Respiratory: Negative for cough and wheezing.   Cardiovascular: Negative for chest pain, palpitations and leg swelling.  Gastrointestinal: Positive for nausea, abdominal pain, diarrhea and abdominal distention. Negative for vomiting, constipation, blood in stool and anal bleeding.  Genitourinary: Negative for hematuria, vaginal bleeding and difficulty urinating.  Musculoskeletal: Positive for back pain. Negative for arthralgias.  Skin:  Negative for rash and wound.  Neurological: Negative for seizures, syncope and headaches.  Hematological: Negative for adenopathy. Does not bruise/bleed easily.  Psychiatric/Behavioral: Negative for confusion.    Blood pressure 122/80, pulse 80, temperature 97.5 F (36.4 C), height  (1.549 m), weight 214 lb (97.07 kg).  Physical Exam Physical Exam WDWN in NAD HEENT:  EOMI, sclera anicteric Neck:  No masses, no thyromegaly Lungs:  CTA bilaterally; normal respiratory effort CV:  Regular rate and rhythm; no murmurs Abd:  +bowel sounds, soft, mildly tender in RUQ; no palpable masses Ext:  Well-perfused; no edema Skin:  Warm, dry; no sign of jaundice  Data Reviewed Nm Hepato W/eject Fract  08/15/2013   CLINICAL DATA:  Right upper quadrant pain with nausea.  EXAM: NUCLEAR MEDICINE HEPATOBILIARY IMAGING WITH GALLBLADDER EF  TECHNIQUE: Sequential images of the abdomen were obtained out to 60 minutes following intravenous administration of radiopharmaceutical. After slow intravenous infusion of 1.95 micrograms Cholecystokinin, gallbladder ejection fraction was determined.  RADIOPHARMACEUTICALS:  5 Millicurie Tc-51m Choletec  COMPARISON:  Abdominal ultrasound of July 31, 2013  FINDINGS: There is adequate uptake of the radiopharmaceutical by the liver. The intrahepatic ducts and common bile duct are visible by 10 min and bowel activity is visible by 20 min. Gallbladder activity is visible by 30 min. The 30 min gallbladder ejection fraction is 91%. At 30 min, normal ejection fraction is greater than 30%.  The patient did not experience symptoms during CCK infusion.  IMPRESSION: Normal hepatobiliary scan with normal gallbladder ejection fraction.   Electronically Signed   By: David  [place]   On: 08/15/2013 09:27    CLINICAL DATA: Pain.  EXAM:  ULTRASOUND ABDOMEN COMPLETE  COMPARISON: Hepatobiliary scan 04/12/2008. Ultrasound 04/06/2008.  FINDINGS:  Gallbladder:  Gallbladder is contracted, the  patient is not NPO. No gallstones  noted. Gallbladder wall normal in thickness. No pericholecystic  fluid collection. Negative Murphy sign .  Common bile duct:  Diameter: 6 mm  Liver:  Liver is echodense suggesting fatty infiltration.  IVC:  No abnormality visualized.  Pancreas:  Visualized portion unremarkable.  Spleen:  Size and appearance within normal limits.  Right Kidney:  Length: 12.3 cm. Echogenicity within normal limits. No mass or  hydronephrosis visualized.  Left Kidney:  Length: 13.2 cm. Echogenicity within normal limits. No mass or  hydronephrosis visualized. Changes suggesting scarring left upper  kidney.  Abdominal aorta:  No aneurysm visualized.  Other findings:  None.  IMPRESSION:  1. Contracted gallbladder. Patient was not NPO. No gallstones or  gallbladder wall thickening noted.  2. Liver is echodense suggesting fatty infiltration.  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  The surgical procedure has been discussed with the patient.  Potential risks, benefits, alternative treatments, and expected outcomes have been explained.  All of the patient's questions at this time have been answered.  The likelihood of reaching the patient's treatment goal is good.  The patient understand the proposed surgical procedure  and wishes to proceed.         Rip Hawes K. 09/05/2013, 4:53 PM

## 2013-09-28 NOTE — Anesthesia Preprocedure Evaluation (Addendum)
Anesthesia Evaluation  Patient identified by MRN, date of birth, ID band Patient awake    Reviewed: Allergy & Precautions, H&P , NPO status , Patient's Chart, lab work & pertinent test results  History of Anesthesia Complications Negative for: history of anesthetic complications  Airway Mallampati: II TM Distance: >3 FB Neck ROM: Full  Mouth opening: Limited Mouth Opening  Dental  (+) Teeth Intact, Chipped, Dental Advisory Given,    Pulmonary sleep apnea and Continuous Positive Airway Pressure Ventilation , former smoker,  breath sounds clear to auscultation        Cardiovascular hypertension, Pt. on medications negative cardio ROS  Rhythm:Regular Rate:Normal  '08 stress test: no scar or ischemia, EF 73%   Neuro/Psych Anxiety negative neurological ROS     GI/Hepatic negative GI ROS, Neg liver ROS,   Endo/Other  diabetes, Well Controlled, Type 2, Insulin DependentMorbid obesity  Renal/GU negative Renal ROS     Musculoskeletal   Abdominal (+) + obese,   Peds  Hematology negative hematology ROS (+)   Anesthesia Other Findings   Reproductive/Obstetrics                         Anesthesia Physical Anesthesia Plan  ASA: III  Anesthesia Plan: General   Post-op Pain Management:    Induction: Intravenous  Airway Management Planned: Oral ETT  Additional Equipment:   Intra-op Plan:   Post-operative Plan: Extubation in OR  Informed Consent: I have reviewed the patients History and Physical, chart, labs and discussed the procedure including the risks, benefits and alternatives for the proposed anesthesia with the patient or authorized representative who has indicated his/her understanding and acceptance.   Dental advisory given  Plan Discussed with: Anesthesiologist, Surgeon and CRNA  Anesthesia Plan Comments: (Plan routine monitors, GETA)       Anesthesia Quick Evaluation

## 2013-09-28 NOTE — Interval H&P Note (Signed)
History and Physical Interval Note:  09/28/2013 6:58 AM  Maureen Garza  has presented today for surgery, with the diagnosis of Chronic Cholecystis  The various methods of treatment have been discussed with the patient and family. After consideration of risks, benefits and other options for treatment, the patient has consented to  Procedure(s): LAPAROSCOPIC CHOLECYSTECTOMY WITH INTRAOPERATIVE CHOLANGIOGRAM (N/A) as a surgical intervention .  The patient's history has been reviewed, patient examined, no change in status, stable for surgery.  I have reviewed the patient's chart and labs.  Questions were answered to the patient's satisfaction.     Rhyse Loux K.

## 2013-09-28 NOTE — Op Note (Signed)
Laparoscopic Cholecystectomy with IOC Procedure Note  Indications: This patient presents with symptomatic gallbladder disease and will undergo laparoscopic cholecystectomy.  Pre-operative Diagnosis: Chronic cholecystitis  Post-operative Diagnosis: Same  Surgeon: Arben Packman K.   Assistants: none  Anesthesia: General endotracheal anesthesia  ASA Class: 2  Procedure Details  The patient was seen again in the Holding Room. The risks, benefits, complications, treatment options, and expected outcomes were discussed with the patient. The possibilities of reaction to medication, pulmonary aspiration, perforation of viscus, bleeding, recurrent infection, finding a normal gallbladder, the need for additional procedures, failure to diagnose a condition, the possible need to convert to an open procedure, and creating a complication requiring transfusion or operation were discussed with the patient. The likelihood of improving the patient's symptoms with return to their baseline status is good.  The patient and/or family concurred with the proposed plan, giving informed consent. The site of surgery properly noted. The patient was taken to Operating Room, identified as Maureen Garza and the procedure verified as Laparoscopic Cholecystectomy with Intraoperative Cholangiogram. A Time Out was held and the above information confirmed.  Prior to the induction of general anesthesia, antibiotic prophylaxis was administered. General endotracheal anesthesia was then administered and tolerated well. After the induction, the abdomen was prepped with Chloraprep and draped in the sterile fashion. The patient was positioned in the supine position.  Local anesthetic agent was injected into the skin near the umbilicus and an incision made. We dissected down to the abdominal fascia with blunt dissection.  The fascia was incised vertically and we entered the peritoneal cavity bluntly.  A pursestring suture of 0-Vicryl was  placed around the fascial opening.  The Hasson cannula was inserted and secured with the stay suture.  Pneumoperitoneum was then created with CO2.  Anesthesia had some difficulty in ventilation with our insufflation because of the patient's body habitus, so we performed the case at 12 mmHg.  An 11-mm port was placed in the subxiphoid position.  Two 5-mm ports were placed in the right upper quadrant. All skin incisions were infiltrated with a local anesthetic agent before making the incision and placing the trocars.   We positioned the patient in reverse Trendelenburg, tilted slightly to the patient's left.  The gallbladder was identified, the fundus grasped and retracted cephalad. There were multiple adhesions of the omentum to the gallbladder.  Adhesions were lysed bluntly and with the electrocautery where indicated, taking care not to injure any adjacent organs or viscus. The infundibulum was grasped and retracted laterally, exposing the peritoneum overlying the triangle of Calot. This was then divided and exposed in a blunt fashion. A critical view of the cystic duct and cystic artery was obtained.  The cystic duct was clearly identified and bluntly dissected circumferentially. The cystic duct was ligated with a clip distally.   An incision was made in the cystic duct and the [facility] cholangiogram catheter introduced. The catheter was secured using a clip. A cholangiogram was then obtained which showed good visualization of the distal and proximal biliary tree with no sign of filling defects or obstruction.  Contrast flowed easily into the duodenum. The catheter was then removed.   The cystic duct was then ligated with clips and divided. The cystic artery was identified, dissected free, ligated with clips and divided as well.   The gallbladder was dissected from the liver bed in retrograde fashion with the electrocautery. The gallbladder was removed and placed in an Endocatch sac. The liver bed was irrigated  and inspected.  Hemostasis was achieved with the electrocautery. Copious irrigation was utilized and was repeatedly aspirated until clear.  The gallbladder and Endocatch sac were then removed through the umbilical port site.  The pursestring suture was used to close the umbilical fascia.    We again inspected the right upper quadrant for hemostasis.  Pneumoperitoneum was released as we removed the trocars.  4-0 Monocryl was used to close the skin.   Benzoin, steri-strips, and clean dressings were applied. The patient was then extubated and brought to the recovery room in stable condition. Instrument, sponge, and needle counts were correct at closure and at the conclusion of the case.   Findings: Cholecystitis without Cholelithiasis  Estimated Blood Loss: less than 50 mL         Drains: none         Specimens: Gallbladder           Complications: None; patient tolerated the procedure well.         Disposition: PACU - hemodynamically stable.         Condition: stable  Maureen Garza. Maureen Skains, MD, [facility]  General/ Trauma Surgery  09/28/2013 8:26 AM

## 2013-09-28 NOTE — Progress Notes (Addendum)
1200 Abdomen tight.  Denies pain.  tegaderm dressing peeled up on right lateral abdomen wound.  Dr Corliss Skains advised.  Received order to redress site.  No further orders received. MD also advised that patient could not tolerate oxycodone for pain due to nausea and vomiting.  He advised he had written Rx for Zofran for nausea.  Patient advised. 1245 Dr Corliss Skains advised patient has dry heaves. Orders received 1330 Denies nausea.  Resting comfortably

## 2013-09-28 NOTE — Anesthesia Postprocedure Evaluation (Signed)
  Anesthesia Post-op Note  Patient: Maureen Garza  Procedure(s) Performed: Procedure(s): LAPAROSCOPIC CHOLECYSTECTOMY WITH INTRAOPERATIVE CHOLANGIOGRAM (N/A)  Patient Location: PACU  Anesthesia Type:General  Level of Consciousness: awake, alert , oriented and patient cooperative  Airway and Oxygen Therapy: Patient Spontanous Breathing  Post-op Pain: mild  Post-op Assessment: Post-op Vital signs reviewed, Patient's Cardiovascular Status Stable, Respiratory Function Stable, Patent Airway, No signs of Nausea or vomiting and Pain level controlled  Post-op Vital Signs: Reviewed and stable  Last Vitals:  Filed Vitals:   09/28/13 0900  BP: 148/61  Pulse: 87  Temp:   Resp: 28    Complications: No apparent anesthesia complications

## 2013-09-28 NOTE — Discharge Instructions (Signed)
What to eat:  For your first meals, you should eat lightly; only small meals initially.  If you do not have nausea, you may eat larger meals.  Avoid spicy, greasy and heavy food.    General Anesthesia, Adult, Care After  Refer to this sheet in the next few weeks. These instructions provide you with information on caring for yourself after your procedure. Your health care provider may also give you more specific instructions. Your treatment has been planned according to current medical practices, but problems sometimes occur. Call your health care provider if you have any problems or questions after your procedure.  WHAT TO EXPECT AFTER THE PROCEDURE  After the procedure, it is typical to experience:  Sleepiness.  Nausea and vomiting. HOME CARE INSTRUCTIONS  For the first 24 hours after general anesthesia:  Have a responsible person with you.  Do not drive a car. If you are alone, do not take public transportation.  Do not drink alcohol.  Do not take medicine that has not been prescribed by your health care provider.  Do not sign important papers or make important decisions.  You may resume a normal diet and activities as directed by your health care provider.  Change bandages (dressings) as directed.  If you have questions or problems that seem related to general anesthesia, call the hospital and ask for the anesthetist or anesthesiologist on call. SEEK MEDICAL CARE IF:  You have nausea and vomiting that continue the day after anesthesia.  You develop a rash. SEEK IMMEDIATE MEDICAL CARE IF:  You have difficulty breathing.  You have chest pain.  You have any allergic problems. Document Released: 04/20/2000 Document Revised: 09/14/2012 Document Reviewed: 07/28/2012  [facility] Patient Information 2014 [place], [place].      [facility] LAPAROSCOPIC SURGERY: POST OP INSTRUCTIONS Always review your discharge instruction sheet given to you by the facility where your surgery  was performed. IF YOU HAVE DISABILITY OR FAMILY LEAVE FORMS, YOU MUST BRING THEM TO THE OFFICE FOR PROCESSING.   DO NOT GIVE THEM TO YOUR DOCTOR.  1. A prescription for pain medication will be given to you upon discharge.  Take your pain medication as prescribed, if needed.  If narcotic pain medicine is not needed, then you may take acetaminophen (Tylenol) or ibuprofen (Advil) as needed. 2. Take your usually prescribed medications unless otherwise directed. 3. If you need a refill on your pain medication, please contact your pharmacy.  They will contact our office to request authorization. Prescriptions will not be filled after 5pm or on week-ends. 4. You should follow a light diet the first few days after arrival home, such as soup and crackers, etc.  Be sure to include lots of fluids daily. 5. Most patients will experience some swelling and bruising in the area of the incisions.  Ice packs will help.  Swelling and bruising can take several days to resolve.  6. It is common to experience some constipation if taking pain medication after surgery.  Increasing fluid intake and taking a stool softener (such as Colace) will usually help or prevent this problem from occurring.  A mild laxative (Milk of Magnesia or Miralax) should be taken according to package instructions if there are no bowel movements after 48 hours. 7. Unless discharge instructions indicate otherwise, you may remove your bandages 48 hours after surgery, and you may shower at that time.  You will have steri-strips (small skin tapes) in place directly over the incision.  These strips should be  left on the skin for 7-10 days.  If your surgeon used skin glue on the incision, you may shower in 24 hours.  The glue will flake off over the next 2-3 weeks.  Any sutures or staples will be removed at the office during your follow-up visit. 8. ACTIVITIES:  You may resume regular (light) daily activities beginning the next day--such as daily self-care,  walking, climbing stairs--gradually increasing activities as tolerated.  You may have sexual intercourse when it is comfortable.  Refrain from any heavy lifting or straining until approved by your doctor. a. You may drive when you are no longer taking prescription pain medication, you can comfortably wear a seatbelt, and you can safely maneuver your car and apply brakes. b. RETURN TO WORK:   2-3 weeks 9. You should see your doctor in the office for a follow-up appointment approximately 2-3 weeks after your surgery.  Make sure that you call for this appointment within a day or two after you arrive home to insure a convenient appointment time. 10. OTHER INSTRUCTIONS: ________________________________________________________________________ WHEN TO CALL YOUR DOCTOR: 1. Fever over 101.0 2. Inability to urinate 3. Continued bleeding from incision. 4. Increased pain, redness, or drainage from the incision. 5. Increasing abdominal pain  The clinic staff is available to answer your questions during regular business hours.  Please dont hesitate to call and ask to speak to one of the nurses for clinical concerns.  If you have a medical emergency, go to the nearest emergency room or call 911.  A surgeon from [facility] is always on call at the hospital. [address], [address], [place], [place]  [address] ? [address] [address], [place], [place]   [address] [phone] ? [phone] ? FAX [phone] Web site: [email or web address]

## 2013-09-29 ENCOUNTER — Encounter (HOSPITAL_COMMUNITY): Payer: Self-pay | Admitting: Surgery

## 2013-11-03 ENCOUNTER — Encounter (HOSPITAL_COMMUNITY): Payer: Self-pay | Admitting: Pharmacy Technician

## 2013-11-06 ENCOUNTER — Other Ambulatory Visit (HOSPITAL_COMMUNITY): Payer: Self-pay | Admitting: Orthopedic Surgery

## 2013-11-06 ENCOUNTER — Encounter (HOSPITAL_COMMUNITY): Payer: Self-pay

## 2013-11-06 ENCOUNTER — Encounter (HOSPITAL_COMMUNITY)
Admission: RE | Admit: 2013-11-06 | Discharge: 2013-11-06 | Disposition: A | Discharge status: Home / Self Care | Payer: 59 | Source: Ambulatory Visit | Attending: Orthopedic Surgery | Admitting: Orthopedic Surgery

## 2013-11-06 DIAGNOSIS — M75102 Unspecified rotator cuff tear or rupture of left shoulder, not specified as traumatic: Secondary | ICD-10-CM | POA: Diagnosis not present

## 2013-11-06 DIAGNOSIS — Z7982 Long term (current) use of aspirin: Secondary | ICD-10-CM | POA: Diagnosis not present

## 2013-11-06 DIAGNOSIS — Z794 Long term (current) use of insulin: Secondary | ICD-10-CM | POA: Diagnosis not present

## 2013-11-06 DIAGNOSIS — Z87891 Personal history of nicotine dependence: Secondary | ICD-10-CM | POA: Diagnosis not present

## 2013-11-06 DIAGNOSIS — Y929 Unspecified place or not applicable: Secondary | ICD-10-CM | POA: Diagnosis not present

## 2013-11-06 DIAGNOSIS — S43432A Superior glenoid labrum lesion of left shoulder, initial encounter: Secondary | ICD-10-CM | POA: Diagnosis not present

## 2013-11-06 DIAGNOSIS — I10 Essential (primary) hypertension: Secondary | ICD-10-CM | POA: Diagnosis not present

## 2013-11-06 DIAGNOSIS — X58XXXA Exposure to other specified factors, initial encounter: Secondary | ICD-10-CM | POA: Diagnosis not present

## 2013-11-06 DIAGNOSIS — E119 Type 2 diabetes mellitus without complications: Secondary | ICD-10-CM | POA: Diagnosis not present

## 2013-11-06 DIAGNOSIS — Z79899 Other long term (current) drug therapy: Secondary | ICD-10-CM | POA: Diagnosis not present

## 2013-11-06 DIAGNOSIS — G4730 Sleep apnea, unspecified: Secondary | ICD-10-CM | POA: Diagnosis not present

## 2013-11-06 DIAGNOSIS — Z885 Allergy status to narcotic agent status: Secondary | ICD-10-CM | POA: Diagnosis not present

## 2013-11-06 DIAGNOSIS — M7502 Adhesive capsulitis of left shoulder: Secondary | ICD-10-CM | POA: Diagnosis present

## 2013-11-06 LAB — COMPREHENSIVE METABOLIC PANEL
ALBUMIN: 3.6 g/dL (ref 3.5–5.2)
ALT: 233 U/L — AB (ref 0–35)
AST: 86 U/L — AB (ref 0–37)
Alkaline Phosphatase: 272 U/L — ABNORMAL HIGH (ref 39–117)
Anion gap: 15 (ref 5–15)
BUN: 14 mg/dL (ref 6–23)
CO2: 25 mEq/L (ref 19–32)
CREATININE: 0.71 mg/dL (ref 0.50–1.10)
Calcium: 10 mg/dL (ref 8.4–10.5)
Chloride: 98 mEq/L (ref 96–112)
GFR calc Af Amer: >90 mL/min (ref >90)
Glucose, Bld: 213 mg/dL — ABNORMAL HIGH (ref 70–99)
Potassium: 3.8 mEq/L (ref 3.7–5.3)
SODIUM: 138 meq/L (ref 137–147)
TOTAL PROTEIN: 7.2 g/dL (ref 6.0–8.3)
Total Bilirubin: 0.3 mg/dL (ref 0.3–1.2)

## 2013-11-06 LAB — CBC
HCT: 39.1 % (ref 36.0–46.0)
Hemoglobin: 13.5 g/dL (ref 12.0–15.0)
MCH: 32.3 pg (ref 26.0–34.0)
MCHC: 34.5 g/dL (ref 30.0–36.0)
MCV: 93.5 fL (ref 78.0–100.0)
Platelets: 246 10*3/uL (ref 150–400)
RBC: 4.18 MIL/uL (ref 3.87–5.11)
RDW: 12.9 % (ref 11.5–15.5)
WBC: 7.2 10*3/uL (ref 4.0–10.5)

## 2013-11-06 LAB — PROTIME-INR
INR: 0.84 (ref 0.00–1.49)
PROTHROMBIN TIME: 11.5 s — AB (ref 11.6–15.2)

## 2013-11-06 LAB — APTT: aPTT: 25 seconds (ref 24–37)

## 2013-11-06 NOTE — Pre-Procedure Instructions (Signed)
Maureen Garza  11/06/2013   Your procedure is scheduled on:  Wednesday, October 14  [facility] [employer] AM.  Call this number if you have problems the morning of surgery: [phone]   Remember:   Do not eat food or drink liquids after midnight.Tuesday night   Take these medicines the morning of surgery with A SIP OF WATER: none   Do not wear jewelry, make-up or nail polish.  Do not wear lotions, powders, or perfumes.   Do not shave 48 hours prior to surgery.   Do not bring valuables to the hospital.  [facility] is not responsible   for any belongings or valuables.               Contacts, dentures or bridgework may not be worn into surgery.  Leave suitcase in the car. After surgery it may be brought to your room.  For patients admitted to the hospital, discharge time is determined by your   treatment team.               Patients discharged the day of surgery will not be allowed to drive  home.  Name and phone number of your driver:      Special Instructions: [place] - Preparing for Surgery  Before surgery, you can play an important role.  Because skin is not sterile, your skin needs to be as free of germs as possible.  You can reduce the number of germs on you skin by washing with CHG (chlorahexidine gluconate) soap before surgery.  CHG is an antiseptic cleaner which kills germs and bonds with the skin to continue killing germs even after washing.  Please DO NOT use if you have an allergy to CHG or antibacterial soaps.  If your skin becomes reddened/irritated stop using the CHG and inform your nurse when you arrive at Short Stay.  Do not shave (including legs and underarms) for at least 48 hours prior to the first CHG shower.  You may shave your face.  Please follow these instructions carefully:   1.  Shower with CHG Soap the night before surgery and the    morning of Surgery.  2.  If you choose to wash your hair, wash your hair first as usual  with your  normal shampoo.  3.  After you shampoo, rinse your hair and body thoroughly to remove the   Shampoo.  4.  Use CHG as you would any other liquid soap.  You can apply chg directly   to the skin and wash gently with scrungie or a clean washcloth.  5.  Apply the CHG Soap to your body ONLY FROM THE NECK DOWN.   Do not use on open wounds or open sores.  Avoid contact with your eyes,    ears, mouth and genitals (private parts).  Wash genitals (private parts)    with your normal soap.  6.  Wash thoroughly, paying special attention to the area where your surgery   will be performed.  7.  Thoroughly rinse your body with warm water from the neck down.  8.  DO NOT shower/wash with your normal soap after using and rinsing off  the CHG Soap.  9.  Pat yourself dry with a clean towel.            10.  Wear clean pajamas.            11.  Place clean sheets on your bed  the night of your first shower and do not sleep with pets.  Day of Surgery  Do not apply any lotions/deoderants the morning of surgery.  Please wear clean clothes to the hospital/surgery center.     Please read over the following fact sheets that you were given: Pain Booklet, Coughing and Deep Breathing and Surgical Site Infection Prevention

## 2013-11-07 MED ORDER — CEFAZOLIN SODIUM-DEXTROSE 2-3 GM-% IV SOLR
2.0000 g | INTRAVENOUS | Status: AC
  Administered 2013-11-08: 2 g via INTRAVENOUS
  Filled 2013-11-07: qty 50

## 2013-11-07 NOTE — Progress Notes (Signed)
Anesthesia Chart Review:  Patient is a 53 year old female scheduled for left shoulder arthroscopy, debridement, decompression, and manipulation on 11/08/13 by Dr. Sharol Given.  History includes left frozen shoulder, OSA with CPAP, former smoker, DM2, murmur (not specified), tonsillectomy, hysterectomy, elective cholecystectomy with elevated LFTs 09/28/13. BMI is consistent with morbid obesity. PCP is Dr. Delphina Cahill.   Meds: MVI, ASA 81 mg, Omega 3, HCTZ, Novolog, Lantus, lisinopril, KCl, pravastatin.  EKG on 09/26/13 showed: NSR.  CXR 09/26/13 showed: No active cardiopulmonary disease.  Abdominal ultrasound (pre-cholecystectomy) on 07/31/13 showed: Contracted gallbladder. No gallstones or wall thickening. Liver is echodense suggesting fatty infiltration.  Preoperative labs noted. CBC WNL. Cr 0.71, glucose 213.  PT/PTT are not elevated. Alk Phos 272 (previously 122 07/30/13), AST 86 (previously 139 07/30/13), ALT 233 (previously 106 7/5/1/5). She has had elevated LFTs dating back to 04/14/08 with Alk Phos 254, AST 40, ALT 167 at that time. She denied ETOH use. She is on statin therapy and had fatty liver on ultrasound. Her hepatitis A, B, C screening labs were either negative or non-reactive on 07/30/13.   I reviewed above with anesthesiologist Dr. Conrad Bremerton.  Patient with intermittently elevated LFTs dating back to at least 03/27/08. She is s/p cholecystectomy last month. AST has decreased since then, but ALT and Alk Ph are higher.  Fatty liver and statin therapy could be contributing. She was negative for hepatitis A, B, C earlier this year.  Patient to be further evaluated by her assigned anesthesiologist on the day of surgery.  If no acute GI symptoms or other changes then it is anticipated that she can proceed as planned without repeat preoperative labs.  I did route CMET results to Dr. Nevada Crane (PCP) for follow-up purposes.  [facility] Stay Center/Anesthesiology Phone [phone] 11/07/2013 11:26  AM

## 2013-11-08 ENCOUNTER — Encounter (HOSPITAL_COMMUNITY): Payer: Self-pay | Admitting: Anesthesiology

## 2013-11-08 ENCOUNTER — Encounter (HOSPITAL_COMMUNITY)
Admission: RE | Disposition: A | Discharge status: Home / Self Care | Payer: Self-pay | Source: Ambulatory Visit | Attending: Orthopedic Surgery

## 2013-11-08 ENCOUNTER — Encounter (HOSPITAL_COMMUNITY): Payer: 59 | Admitting: Vascular Surgery

## 2013-11-08 ENCOUNTER — Ambulatory Visit (HOSPITAL_COMMUNITY): Payer: 59 | Admitting: Anesthesiology

## 2013-11-08 ENCOUNTER — Ambulatory Visit (HOSPITAL_COMMUNITY)
Admission: RE | Admit: 2013-11-08 | Discharge: 2013-11-08 | Disposition: A | Discharge status: Home / Self Care | Payer: 59 | Source: Ambulatory Visit | Attending: Orthopedic Surgery | Admitting: Orthopedic Surgery

## 2013-11-08 DIAGNOSIS — X58XXXA Exposure to other specified factors, initial encounter: Secondary | ICD-10-CM | POA: Insufficient documentation

## 2013-11-08 DIAGNOSIS — I10 Essential (primary) hypertension: Secondary | ICD-10-CM | POA: Insufficient documentation

## 2013-11-08 DIAGNOSIS — M7502 Adhesive capsulitis of left shoulder: Secondary | ICD-10-CM | POA: Insufficient documentation

## 2013-11-08 DIAGNOSIS — G4730 Sleep apnea, unspecified: Secondary | ICD-10-CM | POA: Insufficient documentation

## 2013-11-08 DIAGNOSIS — Z794 Long term (current) use of insulin: Secondary | ICD-10-CM | POA: Insufficient documentation

## 2013-11-08 DIAGNOSIS — E119 Type 2 diabetes mellitus without complications: Secondary | ICD-10-CM | POA: Insufficient documentation

## 2013-11-08 DIAGNOSIS — Z7982 Long term (current) use of aspirin: Secondary | ICD-10-CM | POA: Insufficient documentation

## 2013-11-08 DIAGNOSIS — Z885 Allergy status to narcotic agent status: Secondary | ICD-10-CM | POA: Insufficient documentation

## 2013-11-08 DIAGNOSIS — Z79899 Other long term (current) drug therapy: Secondary | ICD-10-CM | POA: Insufficient documentation

## 2013-11-08 DIAGNOSIS — S43432A Superior glenoid labrum lesion of left shoulder, initial encounter: Secondary | ICD-10-CM | POA: Insufficient documentation

## 2013-11-08 DIAGNOSIS — Y929 Unspecified place or not applicable: Secondary | ICD-10-CM | POA: Insufficient documentation

## 2013-11-08 DIAGNOSIS — Z87891 Personal history of nicotine dependence: Secondary | ICD-10-CM | POA: Insufficient documentation

## 2013-11-08 DIAGNOSIS — M75102 Unspecified rotator cuff tear or rupture of left shoulder, not specified as traumatic: Secondary | ICD-10-CM | POA: Insufficient documentation

## 2013-11-08 DIAGNOSIS — E11618 Type 2 diabetes mellitus with other diabetic arthropathy: Secondary | ICD-10-CM

## 2013-11-08 DIAGNOSIS — M7500 Adhesive capsulitis of unspecified shoulder: Secondary | ICD-10-CM

## 2013-11-08 HISTORY — PX: SHOULDER ARTHROSCOPY: SHX128

## 2013-11-08 LAB — GLUCOSE, CAPILLARY
GLUCOSE-CAPILLARY: 139 mg/dL — AB (ref 70–99)
Glucose-Capillary: 276 mg/dL — ABNORMAL HIGH (ref 70–99)

## 2013-11-08 SURGERY — ARTHROSCOPY, SHOULDER
Anesthesia: Regional | Site: Shoulder | Laterality: Left

## 2013-11-08 MED ORDER — PROPOFOL 10 MG/ML IV BOLUS
INTRAVENOUS | Status: AC
  Filled 2013-11-08: qty 20

## 2013-11-08 MED ORDER — HYDROMORPHONE HCL 1 MG/ML IJ SOLN: 0.2500 mg | INTRAMUSCULAR | Status: DC | PRN

## 2013-11-08 MED ORDER — MIDAZOLAM HCL 5 MG/5ML IJ SOLN
INTRAMUSCULAR | Status: DC | PRN
  Administered 2013-11-08: 1 mg via INTRAVENOUS

## 2013-11-08 MED ORDER — LIDOCAINE HCL (CARDIAC) 20 MG/ML IV SOLN
INTRAVENOUS | Status: DC | PRN
  Administered 2013-11-08: 70 mg via INTRAVENOUS

## 2013-11-08 MED ORDER — DEXAMETHASONE SODIUM PHOSPHATE 4 MG/ML IJ SOLN
INTRAMUSCULAR | Status: DC | PRN
  Administered 2013-11-08: 4 mg via PERINEURAL

## 2013-11-08 MED ORDER — NEOSTIGMINE METHYLSULFATE 10 MG/10ML IV SOLN
INTRAVENOUS | Status: DC | PRN
  Administered 2013-11-08: 3 mg via INTRAVENOUS

## 2013-11-08 MED ORDER — ONDANSETRON HCL 4 MG/2ML IJ SOLN
INTRAMUSCULAR | Status: DC | PRN
  Administered 2013-11-08: 4 mg via INTRAVENOUS

## 2013-11-08 MED ORDER — MIDAZOLAM HCL 2 MG/2ML IJ SOLN
INTRAMUSCULAR | Status: AC
  Filled 2013-11-08: qty 2

## 2013-11-08 MED ORDER — ALBUTEROL SULFATE HFA 108 (90 BASE) MCG/ACT IN AERS
INHALATION_SPRAY | RESPIRATORY_TRACT | Status: DC | PRN
  Administered 2013-11-08 (×2): 8 via RESPIRATORY_TRACT

## 2013-11-08 MED ORDER — TRAMADOL HCL 50 MG PO TABS: 50.0000 mg | ORAL_TABLET | Freq: Four times a day (QID) | ORAL | Status: AC | PRN

## 2013-11-08 MED ORDER — FENTANYL CITRATE 0.05 MG/ML IJ SOLN
INTRAMUSCULAR | Status: AC
  Filled 2013-11-08: qty 5

## 2013-11-08 MED ORDER — EPHEDRINE SULFATE 50 MG/ML IJ SOLN
INTRAMUSCULAR | Status: DC | PRN
  Administered 2013-11-08 (×3): 5 mg via INTRAVENOUS

## 2013-11-08 MED ORDER — SODIUM CHLORIDE 0.9 % IR SOLN
Status: DC | PRN
  Administered 2013-11-08: 1000 mL

## 2013-11-08 MED ORDER — PHENYLEPHRINE HCL 10 MG/ML IJ SOLN
10.0000 mg | INTRAVENOUS | Status: DC | PRN
  Administered 2013-11-08: 40 ug/min via INTRAVENOUS

## 2013-11-08 MED ORDER — ROCURONIUM BROMIDE 100 MG/10ML IV SOLN
INTRAVENOUS | Status: DC | PRN
  Administered 2013-11-08: 20 mg via INTRAVENOUS

## 2013-11-08 MED ORDER — ROPIVACAINE HCL 5 MG/ML IJ SOLN
INTRAMUSCULAR | Status: DC | PRN
  Administered 2013-11-08: 20 mL via PERINEURAL

## 2013-11-08 MED ORDER — GLYCOPYRROLATE 0.2 MG/ML IJ SOLN
INTRAMUSCULAR | Status: DC | PRN
  Administered 2013-11-08: .4 mg via INTRAVENOUS

## 2013-11-08 MED ORDER — PROPOFOL 10 MG/ML IV BOLUS
INTRAVENOUS | Status: DC | PRN
Start: 2013-11-08 — End: 2013-11-08
  Administered 2013-11-08: 30 mg via INTRAVENOUS
  Administered 2013-11-08: 20 mg via INTRAVENOUS
  Administered 2013-11-08: 130 mg via INTRAVENOUS
  Administered 2013-11-08: 20 mg via INTRAVENOUS

## 2013-11-08 MED ORDER — FENTANYL CITRATE 0.05 MG/ML IJ SOLN
INTRAMUSCULAR | Status: DC | PRN
  Administered 2013-11-08: 25 ug via INTRAVENOUS
  Administered 2013-11-08 (×3): 50 ug via INTRAVENOUS

## 2013-11-08 MED ORDER — SUCCINYLCHOLINE CHLORIDE 20 MG/ML IJ SOLN
INTRAMUSCULAR | Status: AC
  Filled 2013-11-08: qty 2

## 2013-11-08 MED ORDER — LACTATED RINGERS IV SOLN
INTRAVENOUS | Status: DC | PRN
  Administered 2013-11-08: 08:00:00 via INTRAVENOUS

## 2013-11-08 MED ORDER — ROCURONIUM BROMIDE 50 MG/5ML IV SOLN
INTRAVENOUS | Status: AC
  Filled 2013-11-08: qty 2

## 2013-11-08 MED ORDER — SUCCINYLCHOLINE CHLORIDE 20 MG/ML IJ SOLN
INTRAMUSCULAR | Status: DC | PRN
  Administered 2013-11-08: 80 mg via INTRAVENOUS

## 2013-11-08 SURGICAL SUPPLY — 37 items
BLADE GREAT WHITE 4.2 (BLADE) ×2 IMPLANT
BLADE GREAT WHITE 4.2MM (BLADE) ×1
BUR OVAL 6.0 (BURR) ×3 IMPLANT
CANNULA SHOULDER 7CM (CANNULA) ×3 IMPLANT
COVER SURGICAL LIGHT HANDLE (MISCELLANEOUS) ×3 IMPLANT
DRAPE INCISE IOBAN 66X45 STRL (DRAPES) ×3 IMPLANT
DRAPE STERI 35X30 U-POUCH (DRAPES) ×3 IMPLANT
DRAPE U-SHAPE 47X51 STRL (DRAPES) ×3 IMPLANT
DRSG ADAPTIC 3X8 NADH LF (GAUZE/BANDAGES/DRESSINGS) ×2 IMPLANT
DRSG EMULSION OIL 3X3 NADH (GAUZE/BANDAGES/DRESSINGS) ×3 IMPLANT
DRSG PAD ABDOMINAL 8X10 ST (GAUZE/BANDAGES/DRESSINGS) ×8 IMPLANT
DURAPREP 26ML APPLICATOR (WOUND CARE) ×3 IMPLANT
GAUZE SPONGE 4X4 12PLY STRL (GAUZE/BANDAGES/DRESSINGS) ×3 IMPLANT
GLOVE BIOGEL PI IND STRL 9 (GLOVE) ×1 IMPLANT
GLOVE BIOGEL PI INDICATOR 9 (GLOVE) ×2
GLOVE SURG ORTHO 9.0 STRL STRW (GLOVE) ×3 IMPLANT
GOWN STRL REUS W/ TWL XL LVL3 (GOWN DISPOSABLE) ×2 IMPLANT
GOWN STRL REUS W/TWL XL LVL3 (GOWN DISPOSABLE) ×6
KIT BASIN OR (CUSTOM PROCEDURE TRAY) ×3 IMPLANT
KIT ROOM TURNOVER OR (KITS) ×3 IMPLANT
MANIFOLD NEPTUNE II (INSTRUMENTS) ×3 IMPLANT
NDL SPNL 18GX3.5 QUINCKE PK (NEEDLE) ×1 IMPLANT
NEEDLE SPNL 18GX3.5 QUINCKE PK (NEEDLE) ×3 IMPLANT
NS IRRIG 1000ML POUR BTL (IV SOLUTION) ×3 IMPLANT
PACK SHOULDER (CUSTOM PROCEDURE TRAY) ×3 IMPLANT
PAD ARMBOARD 7.5X6 YLW CONV (MISCELLANEOUS) ×6 IMPLANT
SET ARTHROSCOPY TUBING (MISCELLANEOUS) ×3
SET ARTHROSCOPY TUBING LN (MISCELLANEOUS) ×1 IMPLANT
SLING ARM IMMOBILIZER XL (CAST SUPPLIES) ×3 IMPLANT
SPONGE GAUZE 4X4 12PLY STER LF (GAUZE/BANDAGES/DRESSINGS) ×2 IMPLANT
SPONGE LAP 4X18 X RAY DECT (DISPOSABLE) ×3 IMPLANT
SUT ETHILON 2 0 FS 18 (SUTURE) ×3 IMPLANT
TAPE CLOTH SURG 6X10 WHT LF (GAUZE/BANDAGES/DRESSINGS) ×2 IMPLANT
TOWEL OR 17X24 6PK STRL BLUE (TOWEL DISPOSABLE) ×6 IMPLANT
WAND 90 DEG TURBOVAC W/CORD (SURGICAL WAND) ×2 IMPLANT
WAND HAND CNTRL MULTIVAC 90 (MISCELLANEOUS) IMPLANT
WATER STERILE IRR 1000ML POUR (IV SOLUTION) ×3 IMPLANT

## 2013-11-08 NOTE — H&P (Signed)
Maureen Garza is an 53 y.o. female.   Chief Complaint: Impingement left shoulder with rotator cuff tear HPI: Maureen Garza is a 53 year old woman who has had progressive increasing symptoms of the left shoulder pain with activities of daily living. She has failed conservative treatment.  Past Medical History  Diagnosis Date  . Heart murmur   . Sleep apnea     cpap  . Diabetes mellitus without complication 2979    IDDM type 2    Past Surgical History  Procedure Laterality Date  . Shoulder surgery Right 2012  . Oophorectomy  2006  . Tonils    . Tubal ligation    . Dilation and curettage of uterus    . Tonsillectomy    . Cholecystectomy N/A 09/28/2013    Procedure: LAPAROSCOPIC CHOLECYSTECTOMY WITH INTRAOPERATIVE CHOLANGIOGRAM;  Surgeon: Donnie Mesa, MD;  Location: [facility];  Service: General;  Laterality: N/A;  . Colonoscopy  08/2013    normal    Family History  Problem Relation Age of Onset  . Diabetes Mother   . Asthma Mother   . Diabetes Father   . Hypertension Father   . Diabetes Sister   . Diabetes Brother   . Asthma Daughter   . Epilepsy Son   . Cancer Maternal Grandmother   . Diabetes Maternal Grandfather   . Diabetes Paternal Grandmother   . Diabetes Paternal Grandfather    Social History:  reports that she has quit smoking. She does not have any smokeless tobacco history on file. She reports that she does not drink alcohol or use illicit drugs.  Allergies:  Allergies  Allergen Reactions  . Oxycodone Nausea And Vomiting  . Codeine Nausea And Vomiting    Medications Prior to Admission  Medication Sig Dispense Refill  . aspirin 81 MG tablet Take 162 mg by mouth at bedtime.       . fish oil-omega-3 fatty acids 1000 MG capsule Take 2 g by mouth daily.      . hydrochlorothiazide (HYDRODIURIL) 25 MG tablet Take 25 mg by mouth every morning.      . insulin aspart (NOVOLOG) 100 UNIT/ML injection Inject 0-40 Units into the skin 3 (three) times daily as needed for high blood  sugar.       . insulin glargine (LANTUS) 100 UNIT/ML injection Inject 80 Units into the skin 2 (two) times daily.      Marland Kitchen lisinopril (PRINIVIL,ZESTRIL) 10 MG tablet Take 10 mg by mouth daily.      . Multiple Vitamins-Minerals (MULTIVITAMIN WITH MINERALS) tablet Take 1 tablet by mouth daily.      . potassium chloride (K-DUR) 10 MEQ tablet Take 10 mEq by mouth every morning.      . pravastatin (PRAVACHOL) 80 MG tablet Take 80 mg by mouth every evening.        Results for orders placed during the hospital encounter of 11/06/13 (from the past 48 hour(s))  APTT     Status: None   Collection Time    11/06/13  1:31 PM      Result Value Ref Range   aPTT 25  24 - 37 seconds  CBC     Status: None   Collection Time    11/06/13  1:31 PM      Result Value Ref Range   WBC 7.2  4.0 - 10.5 K/uL   RBC 4.18  3.87 - 5.11 MIL/uL   Hemoglobin 13.5  12.0 - 15.0 g/dL   HCT 39.1  36.0 - 46.0 %  MCV 93.5  78.0 - 100.0 fL   MCH 32.3  26.0 - 34.0 pg   MCHC 34.5  30.0 - 36.0 g/dL   RDW 12.9  11.5 - 15.5 %   Platelets 246  150 - 400 K/uL  COMPREHENSIVE METABOLIC PANEL     Status: Abnormal   Collection Time    11/06/13  1:31 PM      Result Value Ref Range   Sodium 138  137 - 147 mEq/L   Potassium 3.8  3.7 - 5.3 mEq/L   Chloride 98  96 - 112 mEq/L   CO2 25  19 - 32 mEq/L   Glucose, Bld 213 (*) 70 - 99 mg/dL   BUN 14  6 - 23 mg/dL   Creatinine, Ser 0.71  0.50 - 1.10 mg/dL   Calcium 10.0  8.4 - 10.5 mg/dL   Total Protein 7.2  6.0 - 8.3 g/dL   Albumin 3.6  3.5 - 5.2 g/dL   AST 86 (*) 0 - 37 U/L   ALT 233 (*) 0 - 35 U/L   Alkaline Phosphatase 272 (*) 39 - 117 U/L   Total Bilirubin 0.3  0.3 - 1.2 mg/dL   GFR calc non Af Amer >90  >90 mL/min   GFR calc Af Amer >90  >90 mL/min   Comment: (NOTE)     The eGFR has been calculated using the CKD EPI equation.     This calculation has not been validated in all clinical situations.     eGFR's persistently <90 mL/min signify possible Chronic Kidney     Disease.    Anion gap 15  5 - 15  PROTIME-INR     Status: Abnormal   Collection Time    11/06/13  1:31 PM      Result Value Ref Range   Prothrombin Time 11.5 (*) 11.6 - 15.2 seconds   INR 0.84  0.00 - 1.49   No results found.  Review of Systems  All other systems reviewed and are negative.   There were no vitals taken for this visit. Physical Exam  On examination Maureen Garza has decreased abduction and flexion only to 90. She has pain with internal and external rotation pain with Neer and Hawkins impingement test pain with drop arm test. 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  Plan: Will plan for left shoulder arthroscopy with debridement decompression manipulation under anesthesia. Risks and benefits were discussed Maureen Garza states she understands and wished to proceed at this time.  [name] V 11/08/2013, 6:36 AM

## 2013-11-08 NOTE — Op Note (Signed)
11/08/2013  10:03 AM  PATIENT:  Maureen Garza    PRE-OPERATIVE DIAGNOSIS:  Frozen Left Shoulder with rotator cuff tear and SLAP lesion  POST-OPERATIVE DIAGNOSIS:  Same  PROCEDURE:  Left Shoulder Arthroscopy, Debridement, Decompression, and Manipulation  SURGEON:  DUDA,MARCUS V, MD  PHYSICIAN ASSISTANT:None ANESTHESIA:   General  PREOPERATIVE INDICATIONS:  [name] is a  53 y.o. female with a diagnosis of Frozen Left Shoulder who failed conservative measures and elected for surgical management.    The risks benefits and alternatives were discussed with the patient preoperatively including but not limited to the risks of infection, bleeding, nerve injury, cardiopulmonary complications, the need for revision surgery, among others, and the patient was willing to proceed.  OPERATIVE IMPLANTS: None  OPERATIVE FINDINGS: Adhesions to the subscapularis.  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 She has failed conservative care and presents at this time for surgical intervention. Risk and benefits were discussed including recurrence of adhesions. Patient states she understands and wishes to proceed at this time. Description of procedure patient was brought to the operating room and underwent a general anesthetic after an interscalene block. After adequate levels and anesthesia were obtained patient's placed in the beachchair position and the left upper extremity was prepped using DuraPrep draped into a sterile field. The scope was inserted from the posterior portal into the shoulder joint anterior portals established with outside in technique an 18-gauge spinal. Visualization showed adhesions his subscapularis. An ablation of anesthesia broke most of the adhesions. The lateral one was used to debris the remainder of the adhesions his subscapularis. There is a grade 1 SLAP lesion which was debrided there was partial tearing of the  biceps tendon which was also debrided. There is partial tearing of the rotator cuff which was also debrided. There was good attachment of the rotator cuff along the humeral head. The incisions were removed the scope was inserted from the posterior portal into the subacromial space and in the lateral portal was established. Visualization showed a type III acromion with significant amount of bursitis. The bursa was resected with the shaver subacromial decompression was performed with a bur the electrocautery was used for hemostasis. The isthmus removed the portals closed using 3-0 nylon. The wound was covered with Adaptic ABDs and Hypafix tape. Patient was extubated taken to the PACU in stable condition.

## 2013-11-08 NOTE — Anesthesia Postprocedure Evaluation (Signed)
  Anesthesia Post-op Note  Patient: Maureen Garza  Procedure(s) Performed: Procedure(s): Left Shoulder Arthroscopy, Debridement, Decompression, and Manipulation (Left)  Patient Location: PACU  Anesthesia Type:General and Regional  Level of Consciousness: awake and alert   Airway and Oxygen Therapy: Patient Spontanous Breathing  Post-op Pain: none  Post-op Assessment: Post-op Vital signs reviewed, Patient's Cardiovascular Status Stable, Respiratory Function Stable, Patent Airway, No signs of Nausea or vomiting and Pain level controlled  Post-op Vital Signs: Reviewed and stable  Last Vitals:  Filed Vitals:   11/08/13 1056  BP: 140/45  Pulse: 87  Temp:   Resp: 18    Complications: No apparent anesthesia complications

## 2013-11-08 NOTE — Discharge Instructions (Signed)
Discontinue sling in the morning. Ice elevation left shoulder. Discontinue dressing in 2 days.

## 2013-11-08 NOTE — Anesthesia Preprocedure Evaluation (Addendum)
Anesthesia Evaluation  Patient identified by MRN, date of birth, ID band Patient awake    Reviewed: Allergy & Precautions, H&P , NPO status , Patient's Chart, lab work & pertinent test results  History of Anesthesia Complications Negative for: history of anesthetic complications  Airway Mallampati: II TM Distance: >3 FB Neck ROM: Full  Mouth opening: Limited Mouth Opening  Dental  (+) Teeth Intact, Chipped, Dental Advisory Given,    Pulmonary sleep apnea and Continuous Positive Airway Pressure Ventilation , former smoker,  breath sounds clear to auscultation        Cardiovascular hypertension, Pt. on medications negative cardio ROS  Rhythm:Regular  '08 stress test: no scar or ischemia, EF 73%   Neuro/Psych Anxiety negative neurological ROS     GI/Hepatic negative GI ROS, Neg liver ROS,   Endo/Other  diabetes, Well Controlled, Type 2, Insulin DependentMorbid obesity  Renal/GU negative Renal ROS     Musculoskeletal   Abdominal (+) + obese,   Peds  Hematology negative hematology ROS (+)   Anesthesia Other Findings   Reproductive/Obstetrics                         Anesthesia Physical  Anesthesia Plan  ASA: III  Anesthesia Plan: General and Regional   Post-op Pain Management:    Induction: Intravenous  Airway Management Planned: Oral ETT  Additional Equipment: None  Intra-op Plan:   Post-operative Plan: Extubation in OR  Informed Consent: I have reviewed the patients History and Physical, chart, labs and discussed the procedure including the risks, benefits and alternatives for the proposed anesthesia with the patient or authorized representative who has indicated his/her understanding and acceptance.   Dental advisory given  Plan Discussed with: CRNA and Surgeon  Anesthesia Plan Comments:        Anesthesia Quick Evaluation

## 2013-11-08 NOTE — Anesthesia Procedure Notes (Addendum)
Procedure Name: Intubation Date/Time: 11/08/2013 9:15 AM Performed by: Minus LibertyAVENEL, BRITNEY Pre-anesthesia Checklist: Patient identified, Emergency Drugs available, Suction available, Patient being monitored and Timeout performed Patient Re-evaluated:Patient Re-evaluated prior to inductionOxygen Delivery Method: Circle system utilized Preoxygenation: Pre-oxygenation with 100% oxygen Intubation Type: IV induction Ventilation: Mask ventilation without difficulty Laryngoscope Size: Miller and 2 Grade View: Grade III Tube type: Oral Tube size: 7.0 mm Number of attempts: 1 Airway Equipment and Method: Stylet Placement Confirmation: ETT inserted through vocal cords under direct vision,  positive ETCO2,  CO2 detector and breath sounds checked- equal and bilateral Secured at: 21 cm Tube secured with: Tape Dental Injury: Teeth and Oropharynx as per pre-operative assessment    Anesthesia Regional Block:  Interscalene brachial plexus block  Pre-Anesthetic Checklist: ,, timeout performed, Correct Patient, Correct Site, Correct Laterality, Correct Procedure, Correct Position, site marked, Risks and benefits discussed,  Surgical consent,  Pre-op evaluation,  At surgeon's request and post-op pain management  Laterality: Upper and Left  Prep: chloraprep       Needles:  Injection technique: Single-shot  Needle Type: Echogenic Stimulator Needle          Additional Needles:  Procedures: ultrasound guided (picture in chart) and nerve stimulator Interscalene brachial plexus block  Nerve Stimulator or Paresthesia:  Response: deltoid, 0.5 mA,   Additional Responses:   Narrative:  Start time: 11/08/2013 8:21 AM End time: 11/08/2013 8:29 AM Injection made incrementally with aspirations every 5 mL.  Performed by: Personally  Anesthesiologist: Cray Monnin  Additional Notes: H+P and labs reviewed, risks and benefits discussed with patient, procedure tolerated well without complications

## 2013-11-08 NOTE — Transfer of Care (Signed)
Immediate Anesthesia Transfer of Care Note  Patient: Maureen Garza  Procedure(s) Performed: Procedure(s): Left Shoulder Arthroscopy, Debridement, Decompression, and Manipulation (Left)  Patient Location: PACU  Anesthesia Type:General  Level of Consciousness: awake, alert  and oriented  Airway & Oxygen Therapy: Patient Spontanous Breathing and Patient connected to nasal cannula oxygen  Post-op Assessment: Report given to PACU RN and Post -op Vital signs reviewed and stable  Post vital signs: Reviewed and stable  Complications: No apparent anesthesia complications

## 2013-11-11 ENCOUNTER — Encounter (HOSPITAL_COMMUNITY): Payer: Self-pay | Admitting: Orthopedic Surgery

## 2014-12-01 ENCOUNTER — Ambulatory Visit (INDEPENDENT_AMBULATORY_CARE_PROVIDER_SITE_OTHER): Payer: 59 | Admitting: Family Medicine

## 2014-12-01 VITALS — BP 140/90 | HR 93 | Temp 98.0°F | Resp 16 | Ht 62.5 in | Wt 209.0 lb

## 2014-12-01 DIAGNOSIS — J0100 Acute maxillary sinusitis, unspecified: Secondary | ICD-10-CM | POA: Diagnosis not present

## 2014-12-01 DIAGNOSIS — J069 Acute upper respiratory infection, unspecified: Secondary | ICD-10-CM | POA: Diagnosis not present

## 2014-12-01 MED ORDER — AMOXICILLIN-POT CLAVULANATE 875-125 MG PO TABS: 1.0000 | ORAL_TABLET | Freq: Two times a day (BID) | ORAL | Status: AC

## 2014-12-01 NOTE — Patient Instructions (Signed)
Saline nasal spray atleast 4 times per day, over the counter mucinex or mucinex DM, drink plenty of fluids.  If sinus pressure and discolored nasal discharge not improving into next week - can start augmentin for possible sinus infection. Return to the clinic or go to the nearest emergency room if any of your symptoms worsen or new symptoms occur.  Upper Respiratory Infection, Adult Most upper respiratory infections (URIs) are a viral infection of the air passages leading to the lungs. A URI affects the nose, throat, and upper air passages. The most common type of URI is nasopharyngitis and is typically referred to as "the common cold." URIs run their course and usually go away on their own. Most of the time, a URI does not require medical attention, but sometimes a bacterial infection in the upper airways can follow a viral infection. This is called a secondary infection. Sinus and middle ear infections are common types of secondary upper respiratory infections. Bacterial pneumonia can also complicate a URI. A URI can worsen asthma and chronic obstructive pulmonary disease (COPD). Sometimes, these complications can require emergency medical care and may be life threatening.  CAUSES Almost all URIs are caused by viruses. A virus is a type of germ and can spread from one person to another.  RISKS FACTORS You may be at risk for a URI if:   You smoke.   You have chronic heart or lung disease.  You have a weakened defense (immune) system.   You are very young or very old.   You have nasal allergies or asthma.  You work in crowded or poorly ventilated areas.  You work in health care facilities or schools. SIGNS AND SYMPTOMS  Symptoms typically develop 2-3 days after you come in contact with a cold virus. Most viral URIs last 7-10 days. However, viral URIs from the influenza virus (flu virus) can last 14-18 days and are typically more severe. Symptoms may include:   Runny or stuffy  (congested) nose.   Sneezing.   Cough.   Sore throat.   Headache.   Fatigue.   Fever.   Loss of appetite.   Pain in your forehead, behind your eyes, and over your cheekbones (sinus pain).  Muscle aches.  DIAGNOSIS  Your health care provider may diagnose a URI by:  Physical exam.  Tests to check that your symptoms are not due to another condition such as:  Strep throat.  Sinusitis.  Pneumonia.  Asthma. TREATMENT  A URI goes away on its own with time. It cannot be cured with medicines, but medicines may be prescribed or recommended to relieve symptoms. Medicines may help:  Reduce your fever.  Reduce your cough.  Relieve nasal congestion. HOME CARE INSTRUCTIONS   Take medicines only as directed by your health care provider.   Gargle warm saltwater or take cough drops to comfort your throat as directed by your health care provider.  Use a warm mist humidifier or inhale steam from a shower to increase air moisture. This may make it easier to breathe.  Drink enough fluid to keep your urine clear or pale yellow.   Eat soups and other clear broths and maintain good nutrition.   Rest as needed.   Return to work when your temperature has returned to normal or as your health care provider advises. You may need to stay home longer to avoid infecting others. You can also use a face mask and careful hand washing to prevent spread of the virus.  Increase  the usage of your inhaler if you have asthma.   Do not use any tobacco products, including cigarettes, chewing tobacco, or electronic cigarettes. If you need help quitting, ask your health care provider. PREVENTION  The best way to protect yourself from getting a cold is to practice good hygiene.   Avoid oral or hand contact with people with cold symptoms.   Wash your hands often if contact occurs.  There is no clear evidence that vitamin C, vitamin E, echinacea, or exercise reduces the chance of  developing a cold. However, it is always recommended to get plenty of rest, exercise, and practice good nutrition.  SEEK MEDICAL CARE IF:   You are getting worse rather than better.   Your symptoms are not controlled by medicine.   You have chills.  You have worsening shortness of breath.  You have brown or red mucus.  You have yellow or brown nasal discharge.  You have pain in your face, especially when you bend forward.  You have a fever.  You have swollen neck glands.  You have pain while swallowing.  You have white areas in the back of your throat. SEEK IMMEDIATE MEDICAL CARE IF:   You have severe or persistent:  Headache.  Ear pain.  Sinus pain.  Chest pain.  You have chronic lung disease and any of the following:  Wheezing.  Prolonged cough.  Coughing up blood.  A change in your usual mucus.  You have a stiff neck.  You have changes in your:  Vision.  Hearing.  Thinking.  Mood. MAKE SURE YOU:   Understand these instructions.  Will watch your condition.  Will get help right away if you are not doing well or get worse.   This information is not intended to replace advice given to you by your health care provider. Make sure you discuss any questions you have with your health care provider.   Document Released: 07/08/2000 Document Revised: 05/29/2014 Document Reviewed: 04/19/2013 Elsevier Interactive Patient Education 2016 [employer]  Sinusitis, Adult Sinusitis is redness, soreness, and inflammation of the paranasal sinuses. Paranasal sinuses are air pockets within the bones of your face. They are located beneath your eyes, in the middle of your forehead, and above your eyes. In healthy paranasal sinuses, mucus is able to drain out, and air is able to circulate through them by way of your nose. However, when your paranasal sinuses are inflamed, mucus and air can become trapped. This can allow bacteria and other germs to grow and cause  infection. Sinusitis can develop quickly and last only a short time (acute) or continue over a long period (chronic). Sinusitis that lasts for more than 12 weeks is considered chronic. CAUSES Causes of sinusitis include: Allergies. Structural abnormalities, such as displacement of the cartilage that separates your nostrils (deviated septum), which can decrease the air flow through your nose and sinuses and affect sinus drainage. Functional abnormalities, such as when the small hairs (cilia) that line your sinuses and help remove mucus do not work properly or are not present. SIGNS AND SYMPTOMS Symptoms of acute and chronic sinusitis are the same. The primary symptoms are pain and pressure around the affected sinuses. Other symptoms include: Upper toothache. Earache. Headache. Bad breath. Decreased sense of smell and taste. A cough, which worsens when you are lying flat. Fatigue. Fever. Thick drainage from your nose, which often is green and may contain pus (purulent). Swelling and warmth over the affected sinuses. DIAGNOSIS Your health care provider will  perform a physical exam. During your exam, your health care provider may perform any of the following to help determine if you have acute sinusitis or chronic sinusitis: Look in your nose for signs of abnormal growths in your nostrils (nasal polyps). Tap over the affected sinus to check for signs of infection. View the inside of your sinuses using an imaging device that has a light attached (endoscope). If your health care provider suspects that you have chronic sinusitis, one or more of the following tests may be recommended: Allergy tests. Nasal culture. A sample of mucus is taken from your nose, sent to a lab, and screened for bacteria. Nasal cytology. A sample of mucus is taken from your nose and examined by your health care provider to determine if your sinusitis is related to an allergy. TREATMENT Most cases of acute sinusitis are  related to a viral infection and will resolve on their own within 10 days. Sometimes, medicines are prescribed to help relieve symptoms of both acute and chronic sinusitis. These may include pain medicines, decongestants, nasal steroid sprays, or saline sprays. However, for sinusitis related to a bacterial infection, your health care provider will prescribe antibiotic medicines. These are medicines that will help kill the bacteria causing the infection. Rarely, sinusitis is caused by a fungal infection. In these cases, your health care provider will prescribe antifungal medicine. For some cases of chronic sinusitis, surgery is needed. Generally, these are cases in which sinusitis recurs more than 3 times per year, despite other treatments. HOME CARE INSTRUCTIONS Drink plenty of water. Water helps thin the mucus so your sinuses can drain more easily. Use a humidifier. Inhale steam 3-4 times a day (for example, sit in the bathroom with the shower running). Apply a warm, moist washcloth to your face 3-4 times a day, or as directed by your health care provider. Use saline nasal sprays to help moisten and clean your sinuses. Take medicines only as directed by your health care provider. If you were prescribed either an antibiotic or antifungal medicine, finish it all even if you start to feel better. SEEK IMMEDIATE MEDICAL CARE IF: You have increasing pain or severe headaches. You have nausea, vomiting, or drowsiness. You have swelling around your face. You have vision problems. You have a stiff neck. You have difficulty breathing.   This information is not intended to replace advice given to you by your health care provider. Make sure you discuss any questions you have with your health care provider.   Document Released: 01/12/2005 Document Revised: 02/02/2014 Document Reviewed: 01/27/2011 Else[employer] Interactive Patient Education [employer]

## 2014-12-01 NOTE — Progress Notes (Signed)
Subjective:  This chart was scribed for Meredith Staggers, MD by Andrew Au, ED Scribe. This patient was seen in room 13 and the patient's care was started at 9:53 AM.  Patient ID: Maureen Garza, female    DOB: 1960/03/15, 54 y.o.   MRN: [id number]  HPI   Chief Complaint  Patient presents with  . Cough    congestion x 1 week   HPI Comments: Maureen Garza is a 54 y.o. female who presents to the [facility] complaining of a 6 day hx of cough and congestion. Cough was initially dry that gradually worsened in to a productive cough with yellow/green mucous within the past 2-3 days.  She's also had sinus drainage and HA. She has woken up during the night in sweats and feeling warm but no measured fever. She has tried OTC sinus medication and nasal solution twice a day without relief to symptoms. Pt has had sick contacts including her grandchild.   Patient Active Problem List   Diagnosis Date Noted  . Chronic cholecystitis without calculus 09/05/2013  . HYPOKALEMIA 09/18/2009  . ABDOMINAL PAIN 03/27/2008  . SINUSITIS- ACUTE-NOS 01/17/2008  . EPISTAXIS, RECURRENT 01/17/2008  . HYPERCHOLESTEROLEMIA 03/08/2007  . ANXIETY STATE, UNSPECIFIED 12/21/2006  . CHEST PAIN UNSPECIFIED 12/21/2006  . DIABETES MELLITUS, TYPE I 08/16/2006  . ALLERGIC RHINITIS 08/16/2006   Past Medical History  Diagnosis Date  . Heart murmur   . Sleep apnea     cpap  . Diabetes mellitus without complication (HCC) 2004    IDDM type 2   Past Surgical History  Procedure Laterality Date  . Shoulder surgery Right 2012  . Oophorectomy  2006  . Tonils    . Tubal ligation    . Dilation and curettage of uterus    . Tonsillectomy    . Cholecystectomy N/A 09/28/2013    Procedure: LAPAROSCOPIC CHOLECYSTECTOMY WITH INTRAOPERATIVE CHOLANGIOGRAM;  Surgeon: Manus Rudd, MD;  Location: [facility] OR;  Service: General;  Laterality: N/A;  . Colonoscopy  08/2013    normal  . Shoulder arthroscopy Left 11/08/2013   Procedure: Left Shoulder Arthroscopy, Debridement, Decompression, and Manipulation;  Surgeon: Nadara Mustard, MD;  Location: [facility] OR;  Service: Orthopedics;  Laterality: Left;   Allergies  Allergen Reactions  . Oxycodone Nausea And Vomiting  . Codeine Nausea And Vomiting   Prior to Admission medications   Medication Sig Start Date End Date Taking? Authorizing Provider  aspirin 81 MG tablet Take 162 mg by mouth at bedtime.    Yes Historical Provider, MD  fish oil-omega-3 fatty acids 1000 MG capsule Take 2 g by mouth daily.   Yes Historical Provider, MD  hydrochlorothiazide (HYDRODIURIL) 25 MG tablet Take 25 mg by mouth every morning.   Yes Historical Provider, MD  insulin aspart (NOVOLOG) 100 UNIT/ML injection Inject 0-40 Units into the skin 3 (three) times daily as needed for high blood sugar.    Yes Historical Provider, MD  insulin glargine (LANTUS) 100 UNIT/ML injection Inject 80 Units into the skin 2 (two) times daily.   Yes Historical Provider, MD  lisinopril (PRINIVIL,ZESTRIL) 10 MG tablet Take 10 mg by mouth daily.   Yes Historical Provider, MD  Multiple Vitamins-Minerals (MULTIVITAMIN WITH MINERALS) tablet Take 1 tablet by mouth daily.   Yes Historical Provider, MD  potassium chloride (K-DUR) 10 MEQ tablet Take 10 mEq by mouth every morning.   Yes Historical Provider, MD  pravastatin (PRAVACHOL) 80 MG tablet Take 80 mg by mouth every  evening.   Yes Historical Provider, MD  traMADol (ULTRAM) 50 MG tablet Take 1 tablet (50 mg total) by mouth every 6 (six) hours as needed. Maximum dose= 8 tablets per day 11/08/13  Yes Nadara Mustard, MD   Social History   Social History  . Marital Status: Divorced    Spouse Name: N/A  . Number of Children: N/A  . Years of Education: N/A   Occupational History  . Not on file.   Social History Main Topics  . Smoking status: Former Games developer  . Smokeless tobacco: Not on file  . Alcohol Use: No  . Drug Use: No  . Sexual Activity: No   Other Topics  Concern  . Not on file   Social History Narrative   Review of Systems  Constitutional: Positive for diaphoresis. Negative for fever.  HENT: Positive for congestion, postnasal drip and sinus pressure. Negative for ear discharge.   Respiratory: Positive for cough.   Neurological: Positive for headaches.   Objective:   Physical Exam  Constitutional: She is oriented to person, place, and time. She appears well-developed and well-nourished. No distress.  HENT:  Head: Normocephalic and atraumatic.  Right Ear: Hearing, tympanic membrane, external ear and ear canal normal.  Left Ear: Hearing, tympanic membrane, external ear and ear canal normal.  Nose: No rhinorrhea. Right sinus exhibits no maxillary sinus tenderness and no frontal sinus tenderness. Left sinus exhibits maxillary sinus tenderness. Left sinus exhibits no frontal sinus tenderness.  Mouth/Throat: Oropharynx is clear and moist. No oropharyngeal exudate.  Eyes: Conjunctivae and EOM are normal. Pupils are equal, round, and reactive to light.  Cardiovascular: Normal rate, regular rhythm, normal heart sounds and intact distal pulses.   No murmur heard. Pulmonary/Chest: Effort normal and breath sounds normal. No respiratory distress. She has no wheezes. She has no rhonchi.  Lymphadenopathy:    She has no cervical adenopathy.  Neurological: She is alert and oriented to person, place, and time.  Skin: Skin is warm and dry. No rash noted.  Psychiatric: She has a normal mood and affect. Her behavior is normal.  Vitals reviewed.  Filed Vitals:   12/01/14 0909  BP: 140/90  Pulse: 93  Temp: 98 F (36.7 C)  TempSrc: Oral  Resp: 16  Height: 5' 2.5" (1.588 m)  Weight: 209 lb (94.802 kg)  SpO2: 97%   Assessment & Plan:   Maureen Garza is a 54 y.o. female Acute maxillary sinusitis, recurrence not specified - Plan: amoxicillin-clavulanate (AUGMENTIN) 875-125 MG tablet  Acute upper respiratory infection   Suspected viral URI, but  possible early sinusitis.   -sx care with fluids, rest, mucinex and saline ns  -printed Rx for Augmentin if continue sinus pain and not improving into next week  -RTC precautions   Meds ordered this encounter  Medications  . amoxicillin-clavulanate (AUGMENTIN) 875-125 MG tablet    Sig: Take 1 tablet by mouth 2 (two) times daily.    Dispense:  20 tablet    Refill:  0   Patient Instructions  Saline nasal spray atleast 4 times per day, over the counter mucinex or mucinex DM, drink plenty of fluids.  If sinus pressure and discolored nasal discharge not improving into next week - can start augmentin for possible sinus infection. Return to the clinic or go to the nearest emergency room if any of your symptoms worsen or new symptoms occur.  Upper Respiratory Infection, Adult Most upper respiratory infections (URIs) are a viral infection of the air passages leading  to the lungs. A URI affects the nose, throat, and upper air passages. The most common type of URI is nasopharyngitis and is typically referred to as "the common cold." URIs run their course and usually go away on their own. Most of the time, a URI does not require medical attention, but sometimes a bacterial infection in the upper airways can follow a viral infection. This is called a secondary infection. Sinus and middle ear infections are common types of secondary upper respiratory infections. Bacterial pneumonia can also complicate a URI. A URI can worsen asthma and chronic obstructive pulmonary disease (COPD). Sometimes, these complications can require emergency medical care and may be life threatening.  CAUSES Almost all URIs are caused by viruses. A virus is a type of germ and can spread from one person to another.  RISKS FACTORS You may be at risk for a URI if:   You smoke.   You have chronic heart or lung disease.  You have a weakened defense (immune) system.   You are very young or very old.   You have nasal allergies  or asthma.  You work in crowded or poorly ventilated areas.  You work in health care facilities or schools. SIGNS AND SYMPTOMS  Symptoms typically develop 2-3 days after you come in contact with a cold virus. Most viral URIs last 7-10 days. However, viral URIs from the influenza virus (flu virus) can last 14-18 days and are typically more severe. Symptoms may include:   Runny or stuffy (congested) nose.   Sneezing.   Cough.   Sore throat.   Headache.   Fatigue.   Fever.   Loss of appetite.   Pain in your forehead, behind your eyes, and over your cheekbones (sinus pain).  Muscle aches.  DIAGNOSIS  Your health care provider may diagnose a URI by:  Physical exam.  Tests to check that your symptoms are not due to another condition such as:  Strep throat.  Sinusitis.  Pneumonia.  Asthma. TREATMENT  A URI goes away on its own with time. It cannot be cured with medicines, but medicines may be prescribed or recommended to relieve symptoms. Medicines may help:  Reduce your fever.  Reduce your cough.  Relieve nasal congestion. HOME CARE INSTRUCTIONS   Take medicines only as directed by your health care provider.   Gargle warm saltwater or take cough drops to comfort your throat as directed by your health care provider.  Use a warm mist humidifier or inhale steam from a shower to increase air moisture. This may make it easier to breathe.  Drink enough fluid to keep your urine clear or pale yellow.   Eat soups and other clear broths and maintain good nutrition.   Rest as needed.   Return to work when your temperature has returned to normal or as your health care provider advises. You may need to stay home longer to avoid infecting others. You can also use a face mask and careful hand washing to prevent spread of the virus.  Increase the usage of your inhaler if you have asthma.   Do not use any tobacco products, including cigarettes, chewing  tobacco, or electronic cigarettes. If you need help quitting, ask your health care provider. PREVENTION  The best way to protect yourself from getting a cold is to practice good hygiene.   Avoid oral or hand contact with people with cold symptoms.   Wash your hands often if contact occurs.  There is no clear evidence that  vitamin C, vitamin E, echinacea, or exercise reduces the chance of developing a cold. However, it is always recommended to get plenty of rest, exercise, and practice good nutrition.  SEEK MEDICAL CARE IF:   You are getting worse rather than better.   Your symptoms are not controlled by medicine.   You have chills.  You have worsening shortness of breath.  You have brown or red mucus.  You have yellow or brown nasal discharge.  You have pain in your face, especially when you bend forward.  You have a fever.  You have swollen neck glands.  You have pain while swallowing.  You have white areas in the back of your throat. SEEK IMMEDIATE MEDICAL CARE IF:   You have severe or persistent:  Headache.  Ear pain.  Sinus pain.  Chest pain.  You have chronic lung disease and any of the following:  Wheezing.  Prolonged cough.  Coughing up blood.  A change in your usual mucus.  You have a stiff neck.  You have changes in your:  Vision.  Hearing.  Thinking.  Mood. MAKE SURE YOU:   Understand these instructions.  Will watch your condition.  Will get help right away if you are not doing well or get worse.   This information is not intended to replace advice given to you by your health care provider. Make sure you discuss any questions you have with your health care provider.   Document Released: 07/08/2000 Document Revised: 05/29/2014 Document Reviewed: 04/19/2013 Elsevier Interactive Patient Education 2016 [employer]  Sinusitis, Adult Sinusitis is redness, soreness, and inflammation of the paranasal sinuses. Paranasal sinuses are  air pockets within the bones of your face. They are located beneath your eyes, in the middle of your forehead, and above your eyes. In healthy paranasal sinuses, mucus is able to drain out, and air is able to circulate through them by way of your nose. However, when your paranasal sinuses are inflamed, mucus and air can become trapped. This can allow bacteria and other germs to grow and cause infection. Sinusitis can develop quickly and last only a short time (acute) or continue over a long period (chronic). Sinusitis that lasts for more than 12 weeks is considered chronic. CAUSES Causes of sinusitis include: Allergies. Structural abnormalities, such as displacement of the cartilage that separates your nostrils (deviated septum), which can decrease the air flow through your nose and sinuses and affect sinus drainage. Functional abnormalities, such as when the small hairs (cilia) that line your sinuses and help remove mucus do not work properly or are not present. SIGNS AND SYMPTOMS Symptoms of acute and chronic sinusitis are the same. The primary symptoms are pain and pressure around the affected sinuses. Other symptoms include: Upper toothache. Earache. Headache. Bad breath. Decreased sense of smell and taste. A cough, which worsens when you are lying flat. Fatigue. Fever. Thick drainage from your nose, which often is green and may contain pus (purulent). Swelling and warmth over the affected sinuses. DIAGNOSIS Your health care provider will perform a physical exam. During your exam, your health care provider may perform any of the following to help determine if you have acute sinusitis or chronic sinusitis: Look in your nose for signs of abnormal growths in your nostrils (nasal polyps). Tap over the affected sinus to check for signs of infection. View the inside of your sinuses using an imaging device that has a light attached (endoscope). If your health care provider suspects that you have  chronic  sinusitis, one or more of the following tests may be recommended: Allergy tests. Nasal culture. A sample of mucus is taken from your nose, sent to a lab, and screened for bacteria. Nasal cytology. A sample of mucus is taken from your nose and examined by your health care provider to determine if your sinusitis is related to an allergy. TREATMENT Most cases of acute sinusitis are related to a viral infection and will resolve on their own within 10 days. Sometimes, medicines are prescribed to help relieve symptoms of both acute and chronic sinusitis. These may include pain medicines, decongestants, nasal steroid sprays, or saline sprays. However, for sinusitis related to a bacterial infection, your health care provider will prescribe antibiotic medicines. These are medicines that will help kill the bacteria causing the infection. Rarely, sinusitis is caused by a fungal infection. In these cases, your health care provider will prescribe antifungal medicine. For some cases of chronic sinusitis, surgery is needed. Generally, these are cases in which sinusitis recurs more than 3 times per year, despite other treatments. HOME CARE INSTRUCTIONS Drink plenty of water. Water helps thin the mucus so your sinuses can drain more easily. Use a humidifier. Inhale steam 3-4 times a day (for example, sit in the bathroom with the shower running). Apply a warm, moist washcloth to your face 3-4 times a day, or as directed by your health care provider. Use saline nasal sprays to help moisten and clean your sinuses. Take medicines only as directed by your health care provider. If you were prescribed either an antibiotic or antifungal medicine, finish it all even if you start to feel better. SEEK IMMEDIATE MEDICAL CARE IF: You have increasing pain or severe headaches. You have nausea, vomiting, or drowsiness. You have swelling around your face. You have vision problems. You have a stiff neck. You have  difficulty breathing.   This information is not intended to replace advice given to you by your health care provider. Make sure you discuss any questions you have with your health care provider.   Document Released: 01/12/2005 Document Revised: 02/02/2014 Document Reviewed: 01/27/2011 Elsevier Interactive Patient Education [employer]       By signing my name below, I, Raven Small, attest that this documentation has been prepared under the direction and in the presence of Meredith Staggers, MD.  Electronically Signed: Andrew Au, ED Scribe. 12/01/2014. 10:08 AM.

## 2015-03-26 IMAGING — NM NM HEPATO W/GB/PHARM/[PERSON_NAME]
2 series · 12 of 12 positions shown · non-contrast
Comparison: Abdominal ultrasound July 31, 2013

CLINICAL DATA: Right upper quadrant pain with nausea.

EXAM:
NUCLEAR MEDICINE HEPATOBILIARY IMAGING WITH GALLBLADDER EF
TECHNIQUE: Sequential images of the abdomen were obtained [DATE] minutes
following intravenous administration of radiopharmaceutical. After
slow intravenous infusion of 1.95 micrograms Cholecystokinin,
gallbladder ejection fraction was determined.
RADIOPHARMACEUTICALS:  5 Millicurie Bc-TTm Choletec

[he hepatobiliary · 3.43mm/px · 6 of 60 frames shown (1 of 2)]
[frame 6/60]
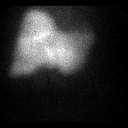
[frame 16/60]
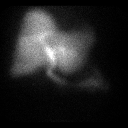
[frame 26/60]
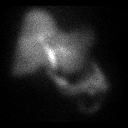
[frame 36/60]
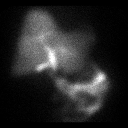
[frame 46/60]
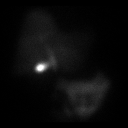
[frame 56/60]
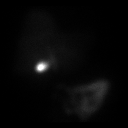

[he hepatobiliary · 3.43mm/px · 6 of 30 frames shown (2 of 2)]
[frame 3/30]
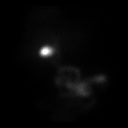
[frame 8/30]
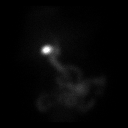
[frame 13/30]
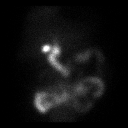
[frame 18/30]
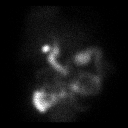
[frame 23/30]
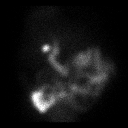
[frame 28/30]
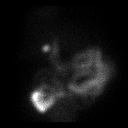

[12 of 12 positions shown; findings below may reference images not displayed]

FINDINGS: There is adequate uptake of the radiopharmaceutical by the liver.
The intrahepatic ducts and common bile duct are visible by 10 min
and bowel activity is visible by 20 min. Gallbladder activity is
visible by 30 min.. The 30 min gallbladder ejection fraction is 91%.
At 30 min, normal ejection fraction is greater than 30%.

The patient did not experience symptoms during CCK infusion.
IMPRESSION: Normal hepatobiliary scan with normal gallbladder ejection fraction.
